# Patient Record
Sex: Male | Born: 1985 | Race: White | Hispanic: No | Marital: Married | Smoking: Never smoker
Health system: Southern US, Community
[De-identification: ages and names within clinical notes are randomized; demographics above are authoritative.]

## PROBLEM LIST (undated history)

## (undated) DIAGNOSIS — K219 Gastro-esophageal reflux disease without esophagitis: Secondary | ICD-10-CM

## (undated) DIAGNOSIS — J050 Acute obstructive laryngitis [croup]: Secondary | ICD-10-CM

## (undated) DIAGNOSIS — B019 Varicella without complication: Secondary | ICD-10-CM

## (undated) DIAGNOSIS — T7840XA Allergy, unspecified, initial encounter: Secondary | ICD-10-CM

## (undated) DIAGNOSIS — G43909 Migraine, unspecified, not intractable, without status migrainosus: Secondary | ICD-10-CM

## (undated) HISTORY — DX: Migraine, unspecified, not intractable, without status migrainosus: G43.909

## (undated) HISTORY — DX: Allergy, unspecified, initial encounter: T78.40XA

## (undated) HISTORY — DX: Acute obstructive laryngitis (croup): J05.0

## (undated) HISTORY — DX: Varicella without complication: B01.9

## (undated) HISTORY — DX: Gastro-esophageal reflux disease without esophagitis: K21.9

---
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
## 2013-04-20 NOTE — Patient Instructions (Signed)
Sinusitis: After Your Visit  Your Care Instructions     Sinusitis is an infection of the lining of the sinus cavities in your head. Sinusitis often follows a cold. It causes pain and pressure in your head and face.  In most cases, sinusitis gets better on its own in 1 to 2 weeks. But some mild symptoms may last for several weeks. Sometimes antibiotics are needed.  Follow-up care is a key part of your treatment and safety. Be sure to make and go to all appointments, and call your doctor if you are having problems. It's also a good idea to know your test results and keep a list of the medicines you take.  How can you care for yourself at home?  ?? Take an over-the-counter pain medicine, such as acetaminophen (Tylenol), ibuprofen (Advil, Motrin), or naproxen (Aleve). Read and follow all instructions on the label.  ?? If the doctor prescribed antibiotics, take them as directed. Do not stop taking them just because you feel better. You need to take the full course of antibiotics.  ?? Be careful when taking over-the-counter cold or flu medicines and Tylenol at the same time. Many of these medicines have acetaminophen, which is Tylenol. Read the labels to make sure that you are not taking more than the recommended dose. Too much acetaminophen (Tylenol) can be harmful.  ?? Breathe warm, moist air from a steamy shower, a hot bath, or a sink filled with hot water. Avoid cold, dry air. Using a humidifier in your home may help. Follow the directions for cleaning the machine.  ?? Use saline (saltwater) nasal washes to help keep your nasal passages open and wash out mucus and bacteria. You can buy saline nose drops at a grocery store or drugstore. Or you can make your own at home by adding 1 teaspoon of salt and 1 teaspoon of baking soda to 2 cups of distilled water. If you make your own, fill a bulb syringe with the solution, insert the tip into your nostril, and squeeze gently. Blow your nose.  ?? Put a hot, wet towel or a warm gel  pack on your face 3 or 4 times a day for 5 to 10 minutes each time.  ?? Try a decongestant nasal spray like oxymetazoline (Afrin). Do not use it for more than 3 days in a row. Using it for more than 3 days can make your congestion worse.  When should you call for help?  Call your doctor now or seek immediate medical care if:  ?? You have new or worse swelling or redness in your face or around your eyes.  ?? You have a new or higher fever.  Watch closely for changes in your health, and be sure to contact your doctor if:  ?? You have new or worse facial pain.  ?? The mucus from your nose becomes thicker (like pus) or has new blood in it.  ?? You are not getting better as expected.   Where can you learn more?   Go to http://www.healthwise.net/[facility]  Enter I933 in the search box to learn more about "Sinusitis: After Your Visit."   ?? 2006-2014 Healthwise, Inc[employer]. Care instructions adapted under license by [employer] (which disclaims liability or warranty for this information). This care instruction is for use with your licensed healthcare professional. If you have questions about a medical condition or this instruction, always ask your healthcare professional. Healthwise, Incorporated disclaims any warranty or liability for your use of this information.    Content Version: 10.1.311062; Current as of: September 30, 2012              Saline Nasal Washes: After Your Visit  Your Care Instructions  Saline nasal washes help keep the nasal passages open by washing out thick or dried mucus. This simple remedy can help relieve symptoms of allergies, sinusitis, and colds. It also can make the nose feel more comfortable by keeping the mucous membranes moist. You may notice a little burning sensation in your nose the first few times you use the solution, but this usually gets better in a few days.  Follow-up care is a key part of your treatment and safety. Be sure to make and go to all appointments, and call your doctor if you are  having problems. It???s also a good idea to know your test results and keep a list of the medicines you take.  How can you care for yourself at home?  ?? You can buy premixed saline solution in a squeeze bottle or other sinus rinse products at a drugstore. Read and follow the instructions on the label.  ?? You also can make your own saline solution by adding 1 teaspoon of salt and 1 teaspoon of baking soda to 2 cups of distilled water.  ?? If you use a homemade solution, pour a small amount into a clean bowl. Using a rubber bulb syringe, squeeze the syringe and place the tip in the salt water. Pull a small amount of the salt water into the syringe by relaxing your hand.  ?? Sit down with your head tilted slightly back. Do not lie down. Put the tip of the bulb syringe or the squeeze bottle a little way into one of your nostrils. Gently squirt a small amount (about 1 teaspoon) into the nostril. Repeat with the other nostril. Some sneezing and gagging are normal at first.  ?? Gently blow your nose.  ?? Wipe the syringe or bottle tip clean after each use.  ?? Repeat this 2 or 3 times a day.  ?? Use nasal washes gently if you have nosebleeds often.  When should you call for help?  Watch closely for changes in your health, and be sure to contact your doctor if:  ?? You often get nosebleeds.  ?? You have problems doing the nasal washes.   Where can you learn more?   Go to http://www.healthwise.net/[facility]  Enter B784 in the search box to learn more about "Saline Nasal Washes: After Your Visit."   ?? 2006-2014 Healthwise, Inc[employer]. Care instructions adapted under license by [employer] (which disclaims liability or warranty for this information). This care instruction is for use with your licensed healthcare professional. If you have questions about a medical condition or this instruction, always ask your healthcare professional. Healthwise, Incorporated disclaims any warranty or liability for your use of this information.   Content Version: 10.1.311062; Current as of: September 30, 2012

## 2013-04-20 NOTE — Progress Notes (Signed)
[facility]  A member of [facility]  [address] [place], [place] [address] * P - [phone] *F - [phone]      Office Progress Note    Primary Care Physician: Lanelle Bal, DO     CHIEF COMPLAINT:     Chief Complaint   Patient presents with   ??? Generalized Body Aches     Pt is here for ear ache, sinus infection, coughong and sore throat.        HISTORY OF PRESENT ILLNESS:   Patient presents today for sinus infection. Rates symptoms as a 6. Symptoms occur constantly. Associated symptoms are ear pressure, nasal congestion, sore throat, pressure in sinuses, and cough with green mucous. Symptoms have been present for 2 days. There are no relieving factors.  There are no worsening factors.   Filed Vitals:    04/20/13 1100   BP: 120/80   Pulse: 84   Temp: 98.3 ??F (36.8 ??C)   TempSrc: Oral   Resp: 16   Height: 5\' 10"  (1.778 m)   Weight: 232 lb 8 oz (105.461 kg)       Body mass index is 33.36 kg/(m^2).  Pain Scale: 0 - No pain/10      Past Medical History:  No past medical history on file.    Family History:  Family History   Problem Relation Age of Onset   ??? No Known Problems Mother    ??? No Known Problems Father    ??? No Known Problems Sister    ??? No Known Problems Brother    ??? No Known Problems Brother        PastSurgical History:  Past Surgical History   Procedure Laterality Date   ??? Hx tonsillectomy     ??? Hx orthopaedic Right      arm fracture       Social History:  History   Smoking status   ??? Never Smoker    Smokeless tobacco   ??? Never Used     History   Alcohol Use   ??? 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 Penicillins Rash       Medication:   none      REVIEW OF SYSTEMS:     General: Patient denies fever, chills, or fatigue.  HEENT:  Nasal congestion and sinus pressure and pressure in ears. Little sore throat. Lots of green  mucous.   Lymph nodes:  Denies swelling or tenderness of lymph nodes.   Chest & Lungs: Denies difficulty breathing or shortness of breath. C/O cough due to postnasal drip.   Heart: Denies chest pain or palpitations.   Skin: Denies changes in skin texture, pigmentation. Denies itching, rash, or eruption.  Endocrine: Denies thyroid tenderness or enlargement. Denies heat or cold intolerance. Denies polyuria, polyphagia, polydipsia. Denies changes in body hair. Denies changes in weight.   Gastrointestinal: Denies heart burn, dysphagia, nausea, vomiting, constipation, or diarrhea. Denies abdominal pain. Denies rectal pain, bleeding, or hemorrhoids   Musculoskeletal: Denies joint stiffness. Denies pain or restriction of ROM.    Neurologic: Denies syncope, seizures, numbness, slurring of speech, headache, loss of memory, and loss of consciousness.  Psychiatric: Denies disturbance of thoughts, mood changes, difficulty  concentrating, sleep disturbances, anxiety, or depression. Denies suicidal thoughts.           PHYSICAL EXAM:    GENERAL: No signs of distress. Alert and cooperative. Well groomed.   HEENT: Normocephalic. Facial symmetry present. Eyes conjunctivae pink, no tearing, no discharge. Sclera white. Cornea clear. Ears normal exam. Tympanic membrane translucent, pearly gray, with no perforations present. Nose & sinuses full nasal passages, tender sinuses to palpate, inflamed nasal turbinates, throat mild erythema  NECK: No jugular vein distention. Trachea midline. Thyroid gland smooth, flat, not enlarges, non tender.  LYMPH: Lymph nodes nonpalpable.   HEART: S1S2 regular rate and rhythm. No murmurs, rub, thrills, bruits, or heaves. No extra heart sounds heard.   LUNGS: Clear to auscultation. No adventitious breath sounds.   ABDOMEN: Abdomen in soft, non-distended with normoactive bowel sounds, no tenderness, and no organomegaly.   SKIN: No rashes or lesions. Skin warm to touch. Turgor resilient. Nails pink, smooth and  cleans. Nails adhered to nail bed with brisk capillary refills.   MUSCULOSKELETAL:  Upper and lower extremities reveal no loss of strength or motion. There is no sensory deficit or instability noted. ROM is full without pain, locking or clicking.  NEUROLOGIC: Cranial nerves 2-12 grossly intact. The patient is alert and oriented x3. Normal mental status without any focal deficits.  PSYCH: The patient's affect is grossly normal, good eye contact, good affect, no suicidal ideations.       Data reviewed.  No results found for any visits on 04/20/13. .      ASSESSMENT:   1. Acute maxillary sinusitis        PLAN:     Orders Placed This Encounter   ??? levofloxacin (LEVAQUIN) 500 mg tablet     Sig: Take 1 tablet by mouth daily.     Dispense:  10 tablet     Refill:  0       Discussed diagnosis and treatment. Reviewed medication dosage, use, side effects, and goals of treatment in detail.  I discussed about the possible side effects or medications. Patient verbalized understanding. I advised patient to call me if any unusual symptoms occur.    All questions answered.       Follow-up Disposition:  Return in about 3 days (around 04/23/2013), or if symptoms worsen or fail to improve.          Tessa Lerner, NP

## 2013-08-27 MED ORDER — CLARITHROMYCIN 500 MG TAB
500 mg | ORAL_TABLET | Freq: Two times a day (BID) | ORAL | Status: AC
Start: 2013-08-27 — End: ?

## 2013-08-27 NOTE — Patient Instructions (Signed)
Rest, fluids

## 2013-08-27 NOTE — Progress Notes (Signed)
Office Progress Note    Primary Care Physician: Lanelle BalSteven Elantra Caprara, DO     Reason for Visit:   Chief Complaint   Patient presents with   ??? Sinus Infection     sore throat, left ear pain, cough       History of Present Illness:  Jonathon Medina is a 28 y.o. male patient who presents with left sided ST, ear pain and nasal congestion with sinus headaches for the last few days. Taking sinus medication that relieved the headaches. Denied fever. Mild cough in AM with production.    Past Medical History:  History reviewed. No pertinent past medical history.     Past Surgical History   Procedure Laterality Date   ??? Hx tonsillectomy     ??? Hx orthopaedic Right      arm fracture       Medication: Denied    Allergy:  Allergies   Allergen Reactions   ??? Penicillins Rash         Social History:   History   Substance Use Topics   ??? Smoking status: Never Smoker    ??? Smokeless tobacco: Never Used   ??? Alcohol Use: Yes      Comment: occasionally     History     Social History Narrative    Married/0    Job: Residence Pharmacist, hospitalDirector student housing at [employer]       Family History:  Family Status   Relation Status Death Age   ??? Mother Alive    ??? Father Alive    ??? Sister Alive    ??? Brother Alive    ??? Brother Alive      Family History   Problem Relation Age of Onset   ??? No Known Problems Mother    ??? No Known Problems Father    ??? No Known Problems Sister    ??? No Known Problems Brother    ??? No Known Problems Brother        Review of Systems:  General: No fever or chills  ENT: No earache or sore throat  Cardiac:  no chest pain or palpitations  Skin: No bleeding or bruising  Endo: No polyuria,  polydipsia  GI: no nausea,  vomiting  GU: no dysuria or discharge  Neuro: No headache or paresthesias  Respiratory: No cough or sputum  Muscular: No arthralgias or arthritis      Physical Exam:  Filed Vitals:    08/27/13 1223   BP: 120/80   Pulse: 70   Temp: 97.9 ??F (36.6 ??C)   TempSrc: Oral   Resp: 16   Height: 5\' 10"  (1.778 m)   Weight: 220 lb (99.791 kg)        Body mass index is 31.57 kg/(m^2).    General: Well developed, well nourished, alert, cooperative, no distress  Eyes: conjunctiva pink, anicteric  ENT: nares patent, pharynx red, TMI and retracted  Neck:  supple, symmetrical, trachea midline, no adenopathy and thyroid: not enlarged, symmetric, no tenderness/mass/nodules   Chest: clear to percussion and auscultation,   Heart: Regular rate and rhythm, S1S2 present or without murmur or extra heart sounds  Abdomen: abdomen is soft without significant tenderness, masses, organomegaly or guarding  Extremity: extremities normal, atraumatic, no cyanosis or edema  Skin: Skin color, texture, turgor normal. No rashes or lesions  Psych: appropiate, and oriented,  Neuro: alert, orientedx3 gait normal, No gross deficits    Assessment:  1. Other acute sinusitis  Plan:   Orders Placed This Encounter   ??? clarithromycin (BIAXIN) 500 mg tablet     Sig: Take 1 Tab by mouth two (2) times a day.     Dispense:  20 Tab     Refill:  0         Patient Instructions   Rest, fluids        Follow-up Disposition:  Return if symptoms worsen or fail to improve.        Lanelle Bal, DO

## 2014-12-21 ENCOUNTER — Other Ambulatory Visit: Payer: Self-pay | Admitting: Medical

## 2014-12-21 DIAGNOSIS — G8929 Other chronic pain: Secondary | ICD-10-CM

## 2014-12-21 DIAGNOSIS — R51 Headache: Principal | ICD-10-CM

## 2014-12-21 DIAGNOSIS — R519 Headache, unspecified: Secondary | ICD-10-CM

## 2014-12-27 ENCOUNTER — Ambulatory Visit
Admission: RE | Admit: 2014-12-27 | Discharge: 2014-12-27 | Disposition: A | Discharge status: Home / Self Care | Payer: BLUE CROSS/BLUE SHIELD | Source: Ambulatory Visit | Attending: Medical | Admitting: Medical

## 2014-12-27 DIAGNOSIS — G8929 Other chronic pain: Secondary | ICD-10-CM

## 2014-12-27 DIAGNOSIS — R519 Headache, unspecified: Secondary | ICD-10-CM

## 2014-12-27 DIAGNOSIS — R51 Headache: Principal | ICD-10-CM

## 2016-09-23 ENCOUNTER — Ambulatory Visit: Payer: Self-pay | Admitting: Medical

## 2016-09-23 ENCOUNTER — Encounter: Payer: Self-pay | Admitting: Medical

## 2016-09-23 VITALS — BP 118/80 | HR 86 | Temp 99.0°F | Resp 16 | Ht 67.0 in | Wt 220.0 lb

## 2016-09-23 DIAGNOSIS — J020 Streptococcal pharyngitis: Secondary | ICD-10-CM

## 2016-09-23 LAB — POCT RAPID STREP A (OFFICE): RAPID STREP A SCREEN: POSITIVE — AB

## 2016-09-23 MED ORDER — AZITHROMYCIN 250 MG PO TABS: ORAL_TABLET | ORAL | 0 refills | Status: DC

## 2016-09-23 NOTE — Progress Notes (Signed)
Subjective:     Anthony Ochoa is a 31 y.o. male who presents for evaluation of sore throat. Associated symptoms include chills, sinus and nasal congestion and sore throat. Onset of symptoms was 3 days ago, and have been gradually worsening since that time. He is drinking plenty of fluids. He has  had a recent close exposure to someone with proven streptococcal pharyngitis. Laryngitis starting yesterday afternoon, intermittent. Took advil today.    Review of Systems  No fever or chills.  St and nasal congestion. No shortness of breath or chest pain. No adenopathy.   Objective:    There were no vitals taken for this visit. General:  alert, cooperative and no distress  Mouth:  abnormal findings: exudates present and moderate oropharyngeal erythema  Neck: mild anterior cervical adenopathy, no JVD and supple, symmetrical, trachea midline. Lungs c;ear to auscultation Heart rrr, no mumurs rubs or gallops   Laboratory Strep test done. Results:positive    Assessment:     Acute Pharyngitis,  Strep throat    Plan:    Use of OTC analgesics recommended as well as salt water gargles.   Azithromycin 250mg  take 2 tablets today then one tablet days 2-5 , take with food. Soft foods , avoiding acidic foods. Return to the clinic in  3-5 d if not improving or if worsening.

## 2016-12-04 ENCOUNTER — Encounter: Payer: Self-pay | Admitting: Medical

## 2016-12-04 ENCOUNTER — Ambulatory Visit: Payer: Self-pay | Admitting: Medical

## 2016-12-04 VITALS — BP 122/80 | HR 74 | Temp 98.4°F | Resp 16 | Ht 71.0 in | Wt 244.0 lb

## 2016-12-04 DIAGNOSIS — R05 Cough: Secondary | ICD-10-CM

## 2016-12-04 DIAGNOSIS — J208 Acute bronchitis due to other specified organisms: Secondary | ICD-10-CM

## 2016-12-04 DIAGNOSIS — Z889 Allergy status to unspecified drugs, medicaments and biological substances status: Secondary | ICD-10-CM

## 2016-12-04 DIAGNOSIS — R059 Cough, unspecified: Secondary | ICD-10-CM

## 2016-12-04 MED ORDER — HYDROCOD POLST-CPM POLST ER 10-8 MG/5ML PO SUER: 5.0000 mL | Freq: Every evening | ORAL | 0 refills | Status: DC | PRN

## 2016-12-04 MED ORDER — PREDNISONE 10 MG (21) PO TBPK: ORAL_TABLET | ORAL | 0 refills | Status: DC

## 2016-12-04 MED ORDER — BENZONATATE 100 MG PO CAPS: 200.0000 mg | ORAL_CAPSULE | Freq: Three times a day (TID) | ORAL | 0 refills | Status: DC | PRN

## 2016-12-04 NOTE — Progress Notes (Signed)
   Subjective:    Patient ID: Anthony Ochoa, male    DOB: 03-13-1986, 31 y.o.   MRN: [id number]  HPI 31 yo male started on  Sunday with  cough productive clear, nasal congestion and drainage clear , and  sore throat. Though it initially was allergies, using allergy medication, Monday used inhaler one puff with no relief. Yesterday increased Albuterol to  2 puffs with some relief.  Unable to sleep at night due to coughing.   Review of Systems  Constitutional: Positive for fatigue. Negative for chills and fever.  HENT: Positive for congestion, postnasal drip, rhinorrhea, sinus pressure, sore throat and trouble swallowing. Negative for ear pain and sinus pain.   Eyes: Negative for pain, discharge and itching.  Respiratory: Positive for cough and shortness of breath. Negative for wheezing.   Cardiovascular: Negative for chest pain.  Gastrointestinal: Negative for diarrhea, nausea and vomiting.  Endocrine: Negative for polydipsia, polyphagia and polyuria.  Genitourinary: Negative for dysuria and hematuria.  Musculoskeletal: Negative for arthralgias and myalgias.  Skin: Negative for rash.  Neurological: Positive for light-headedness. Negative for syncope.  Hematological: Positive for adenopathy.  Psychiatric/Behavioral: Negative for confusion and hallucinations.   Light-headed with coughing at times.    Objective:   Physical Exam  Constitutional: He is oriented to person, place, and time. He appears well-developed and well-nourished.  HENT:  Head: Normocephalic and atraumatic.  Right Ear: Hearing, tympanic membrane, external ear and ear canal normal.  Left Ear: Hearing, tympanic membrane, external ear and ear canal normal.  Nose: Mucosal edema present.  Eyes: Conjunctivae and EOM are normal. Pupils are equal, round, and reactive to light.  Neck: Normal range of motion. Neck supple.  Cardiovascular: Normal rate, regular rhythm and normal heart sounds.  Exam reveals no gallop and no friction  rub.   No murmur heard. Pulmonary/Chest: Effort normal and breath sounds normal.  Musculoskeletal: Normal range of motion.  Lymphadenopathy:    He has no cervical adenopathy.  Neurological: He is alert and oriented to person, place, and time.  Skin: Skin is warm and dry.  Psychiatric: He has a normal mood and affect. His behavior is normal.    Coughing in room with expiratory upper respiratory wheeze.      Assessment & Plan:  Viral Bronchitis, continue to use current medications. E-prescribed prednisone taper ,  6 tablets today then 5 tablets tomorrow , then one less each day thereafter. #21 no refills take with food. Written prescription for  Tussionex  5 ml po at bedtime prn cough 30 cc no refills.  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 Benzonatate  2 capsules 3 times a day ( 2 times per day if using Tussionex at night). Call Friday if bacterial signs or symptoms occur, reviewed  Signs and symptoms with the patient. Use OTC motrin or tylenol take as directed if not feeling well. Return to the clinic in 3 -5 days if not improving or if worsening. No questions at disharge , verbalizes understanding.

## 2016-12-04 NOTE — Patient Instructions (Addendum)
Call Friday if not improving. Take prednisone, benzonatate and tussionex as prescribed. Return to the clinic if not improving.in 3-5 days. Rest , increase fluids.  Take otc motrin or tylenol take as directed.

## 2016-12-05 ENCOUNTER — Encounter: Payer: Self-pay | Admitting: Medical

## 2016-12-05 DIAGNOSIS — J3089 Other allergic rhinitis: Secondary | ICD-10-CM | POA: Insufficient documentation

## 2017-02-18 ENCOUNTER — Encounter: Payer: Self-pay | Admitting: Medical

## 2017-02-18 ENCOUNTER — Ambulatory Visit: Payer: Self-pay | Admitting: Medical

## 2017-02-18 VITALS — BP 130/78 | HR 66 | Temp 97.8°F | Resp 16 | Ht 70.0 in | Wt 243.0 lb

## 2017-02-18 DIAGNOSIS — G43909 Migraine, unspecified, not intractable, without status migrainosus: Secondary | ICD-10-CM

## 2017-02-18 MED ORDER — RIZATRIPTAN BENZOATE 10 MG PO TABS: 10.0000 mg | ORAL_TABLET | ORAL | 0 refills | Status: DC | PRN

## 2017-02-18 MED ORDER — IBUPROFEN 200 MG PO TABS: 800.0000 mg | ORAL_TABLET | Freq: Three times a day (TID) | ORAL | Status: AC

## 2017-02-18 NOTE — Progress Notes (Signed)
   Subjective:    Patient ID: Anthony Ochoa, male    DOB: 06-17-1986, 31 y.o.   MRN: [id number]  HPI  Woke up at  2 :30 am with headache took Tylenol  1 gram and was able to return back to sleep , then woke at  6 am and within one hour of being awake had throbbing eye pain bilateral with it reaching back to towards crown and occiptal region, like previous migraines. Little nausea this am no vomiting, able to return to sleep about  11:30am. Slept till 2 pm and came over for appointment.Wanted refilll over the phone but I had not written for his Maxalt since 2016 ( in [facility] E[facility]). No nausea now. Light sensitiviity, some mild dizziness on/ off. Similar to really bad migraines. 8/10 pain. Out of his Maxalt  medication but feel this works for him. Veryl SpeakUusally helps if taken early on . If migraine presisits without treament has had to take1 then 2 hours later has to take another one. Sound sensitivity with migraines as well.  Review of Systems  Constitutional: Positive for activity change and fatigue. Negative for fever.  HENT: Positive for congestion. Negative for ear pain and sore throat.   Eyes: Positive for photophobia. Negative for discharge and itching.  Respiratory: Negative for cough and shortness of breath.   Cardiovascular: Negative for chest pain.  Gastrointestinal: Negative for abdominal pain.  Genitourinary: Negative for hematuria.  Musculoskeletal: Negative for neck pain.  Skin: Negative for rash.  Neurological: Positive for dizziness and headaches. Negative for syncope and light-headedness.  Psychiatric/Behavioral: Positive for agitation. Negative for behavioral problems, confusion, hallucinations and suicidal ideas. The patient is not nervous/anxious.    Neck aches, get it with tension and stress , working longer hours, leaving office at  6:30 pm to  7pm instead of 5pm x 2 weeks.    Objective:   Physical Exam  Constitutional: He is oriented to person, place, and time. Vital signs are  normal. He appears well-developed and well-nourished.  HENT:  Head: Normocephalic and atraumatic.  Eyes: Pupils are equal, round, and reactive to light. Conjunctivae and EOM are normal.  Neck: Normal range of motion. Neck supple.  Cardiovascular: Normal rate, normal heart sounds and intact distal pulses.   Pulmonary/Chest: Effort normal and breath sounds normal.  Lymphadenopathy:    He has no cervical adenopathy.  Neurological: He is alert and oriented to person, place, and time. No cranial nerve deficit or sensory deficit. He displays a negative Romberg sign. GCS eye subscore is 4. GCS verbal subscore is 5. GCS motor subscore is 6.  Skin: Skin is warm and dry.  Psychiatric: He has a normal mood and affect. His behavior is normal. Judgment and thought content normal.  Nursing note and vitals reviewed. finger to nose, heel to toe with in normal limits.   Looks like he does not feel well  Knows Day , year, president, last known holiday 4th of July.    Assessment & Plan:  Migraine  Rizatriptan 10 mg one tablet by mouth may repeat in  2 hours  one timeif needed. #10 no refills Given Ibuprofen 800 mg in clinic  Lot [id number] exp 05/2019. He is to return to the clinic in 24-48 hours if not improving.

## 2017-02-18 NOTE — Patient Instructions (Signed)

## 2017-04-17 ENCOUNTER — Encounter: Payer: Self-pay | Admitting: Adult Health

## 2017-04-17 ENCOUNTER — Ambulatory Visit: Payer: Self-pay | Admitting: Adult Health

## 2017-04-17 VITALS — BP 128/76 | HR 79 | Temp 98.3°F | Resp 16 | Ht 70.0 in | Wt 246.0 lb

## 2017-04-17 DIAGNOSIS — H60501 Unspecified acute noninfective otitis externa, right ear: Secondary | ICD-10-CM

## 2017-04-17 DIAGNOSIS — H6690 Otitis media, unspecified, unspecified ear: Secondary | ICD-10-CM

## 2017-04-17 MED ORDER — NEOMYCIN-POLYMYXIN-HC 3.5-10000-1 OT SOLN: 3.0000 [drp] | Freq: Three times a day (TID) | OTIC | 0 refills | Status: AC

## 2017-04-17 MED ORDER — AZITHROMYCIN 250 MG PO TABS: ORAL_TABLET | ORAL | 0 refills | Status: DC

## 2017-04-17 NOTE — Progress Notes (Signed)
Subjective:     Patient ID: Anthony Ochoa, male   DOB: February 03, 1986, 31 y.o.   MRN: [id number]  HPI  Patient is a 31 year old male in no acute distress who presents to the clinic in no acute distress. He reports he has had sore throat and congestion x 2 weeks. He has had green/ yellow nasal congestion in the morning. He reports he started with bilateral ear pain this morning- right ear pain worse than left ear. He denies any drainage.  He has been swimming.   Blood pressure 128/76, pulse 79, temperature 98.3 F (36.8 C), temperature source Tympanic, resp. rate 16, height  (1.778 m), weight 246 lb (111.6 kg), SpO2 97 %.  He reports he has an ear infection once per year in the past year. He reports Z-Pack usually clears his ear infection.    Allergies  Allergen Reactions  . Penicillins Rash    31 year old at home was sick this past two weeks and feels better now.   He reports he will be traveling the next two weeks by plane.  Review of Systems  Constitutional: Negative.  Negative for activity change, appetite change, chills, diaphoresis, fatigue, fever and unexpected weight change.  HENT: Positive for congestion (nasal ) and ear pain. Negative for ear discharge, facial swelling, hearing loss, mouth sores, nosebleeds, postnasal drip, rhinorrhea, sinus pain, sinus pressure, sneezing, sore throat, tinnitus, trouble swallowing and voice change.   Eyes: Negative.   Respiratory: Negative.   Cardiovascular: Negative.   Gastrointestinal: Negative.   Endocrine: Negative.   Genitourinary: Negative.   Musculoskeletal: Negative.   Skin: Negative.   Neurological: Negative.   Hematological: Negative.   Psychiatric/Behavioral: Negative.        Objective:   Physical Exam  Constitutional: He is oriented to person, place, and time. He appears well-developed and well-nourished. No distress.  HENT:  Head: Normocephalic and atraumatic.  Right Ear: Hearing normal. No lacerations. There is  swelling (mild ear canal edema able to clearly visualize tympanic membrane ). No tenderness. No foreign bodies. No mastoid tenderness. Tympanic membrane is erythematous (moderate ). Tympanic membrane is not injected, not scarred, not perforated, not retracted and not bulging. Tympanic membrane mobility is normal. A middle ear effusion is present. No hemotympanum.  Left Ear: Hearing, tympanic membrane, external ear and ear canal normal.  Mouth/Throat: Uvula is midline, oropharynx is clear and moist and mucous membranes are normal. No uvula swelling. No oropharyngeal exudate.  Eyes: Pupils are equal, round, and reactive to light. Conjunctivae and EOM are normal. Right eye exhibits no discharge. Left eye exhibits no discharge. No scleral icterus.  Neck: Normal range of motion. Neck supple. No JVD present. No tracheal deviation present. No thyromegaly present.  Cardiovascular: Normal rate, regular rhythm, normal heart sounds and intact distal pulses.  Exam reveals no gallop and no friction rub.   No murmur heard. Pulmonary/Chest: Effort normal and breath sounds normal. No stridor. No respiratory distress. He has no wheezes. He has no rales. He exhibits no tenderness.  Abdominal: He exhibits no distension and no mass. There is no tenderness. There is no rebound and no guarding.  Musculoskeletal: Normal range of motion. He exhibits no edema, tenderness or deformity.  Lymphadenopathy:       Head (right side): Preauricular (mild ) adenopathy present. No submental, no submandibular, no tonsillar, no posterior auricular and no occipital adenopathy present.       Head (left side): No submental, no submandibular, no tonsillar, no preauricular,  no posterior auricular and no occipital adenopathy present.    He has no cervical adenopathy.  Neurological: He is alert and oriented to person, place, and time. He has normal strength and normal reflexes. He displays a negative Romberg sign.  Patient moves on and off of  exam table and in room without difficulty. Gait is normal in hall and in room. Patient is oriented to person place time and situation. Patient answers questions appropriately and engages in conversation.   Skin: Skin is warm, dry and intact. No rash noted. He is not diaphoretic. No cyanosis or erythema. No pallor. Nails show no clubbing.  Psychiatric: He has a normal mood and affect. His speech is normal and behavior is normal. Judgment and thought content normal. Cognition and memory are normal.  Vitals reviewed.      Assessment:    Acute otitis externa of right ear, unspecified type  Acute otitis media, unspecified otitis media type      Plan:     Meds ordered this encounter  Medications  . azithromycin (ZITHROMAX) 250 MG tablet    Sig: Take 2 tablets(500mg )  on day one and take 1 tablet(250MG ) for the next four days until gone    Dispense:  6 tablet    Refill:  0  . neomycin-polymyxin-hydrocortisone (CORTISPORIN) OTIC solution    Sig: Place 3 drops into the right ear 3 (three) times daily.    Dispense:  10 mL    Refill:  0    Return to clinic at any time  if any new symptoms change, worsen or do not improve. Symptoms should improve  within 72 hours and if not improving you should call for an appointment at the clinic is closed or be seen in urgent care/ED if clinic is closed. Follow up with your PCP, Patient verbalized above understanding of all instructions and denies any questions currently.  Patient verbalized understanding of instructions and denies any further questions at this time.

## 2017-04-17 NOTE — Patient Instructions (Signed)
Otitis Externa Otitis externa is an infection of the outer ear canal. The outer ear canal is the area between the outside of the ear and the eardrum. Otitis externa is sometimes called "swimmer's ear." What are the causes? This condition may be caused by:  Swimming in dirty water.  Moisture in the ear.  An injury to the inside of the ear.  An object stuck in the ear.  A cut or scrape on the outside of the ear.  What increases the risk? This condition is more likely to develop in swimmers. What are the signs or symptoms? The first symptom of this condition is often itching in the ear. Later signs and symptoms include:  Swelling of the ear.  Redness in the ear.  Ear pain. The pain may get worse when you pull on your ear.  Pus coming from the ear.  How is this diagnosed? This condition may be diagnosed by examining the ear and testing fluid from the ear for bacteria and funguses. How is this treated? This condition may be treated with:  Antibiotic ear drops. These are often given for 10-14 days.  Medicine to reduce itching and swelling.  Follow these instructions at home:  If you were prescribed antibiotic ear drops, apply them as told by your health care provider. Do not stop using the antibiotic even if your condition improves.  Take over-the-counter and prescription medicines only as told by your health care provider.  Keep all follow-up visits as told by your health care provider. This is important. How is this prevented?  Keep your ear dry. Use the corner of a towel to dry your ear after you swim or bathe.  Avoid scratching or putting things in your ear. Doing these things can damage the ear canal or remove the protective wax that lines it, which makes it easier for bacteria and funguses to grow.  Avoid swimming in lakes, polluted water, or pools that may not have the right amount of chlorine.  Consider making ear drops and putting 3 or 4 drops in each ear after  you swim. Ask your health care provider about how you can make ear drops. Contact a health care provider if:  You have a fever.  After 3 days your ear is still red, swollen, painful, or draining pus.  Your redness, swelling, or pain gets worse.  You have a severe headache.  You have redness, swelling, pain, or tenderness in the area behind your ear. This information is not intended to replace advice given to you by your health care provider. Make sure you discuss any questions you have with your health care provider. Document Released: 07/08/2005 Document Revised: 08/15/2015 Document Reviewed: 04/17/2015 Elsevier Interactive Patient Education  2018 [employer] Otitis Media, Adult Otitis media is redness, soreness, and puffiness (swelling) in the space just behind your eardrum (middle ear). It may be caused by allergies or infection. It often happens along with a cold. Follow these instructions at home:  Take your medicine as told. Finish it even if you start to feel better.  Only take over-the-counter or prescription medicines for pain, discomfort, or fever as told by your doctor.  Follow up with your doctor as told. Contact a doctor if:  You have otitis media only in one ear, or bleeding from your nose, or both.  You notice a lump on your neck.  You are not getting better in 3-5 days.  You feel worse instead of better. Get help right away if:    You have pain that is not helped with medicine.  You have puffiness, redness, or pain around your ear.  You get a stiff neck.  You cannot move part of your face (paralysis).  You notice that the bone behind your ear hurts when you touch it. This information is not intended to replace advice given to you by your health care provider. Make sure you discuss any questions you have with your health care provider. Document Released: 12/25/2007 Document Revised: 12/14/2015 Document Reviewed: 02/02/2013 Elsevier Interactive Patient  Education  2017 [employer]  

## 2017-07-07 DIAGNOSIS — Z23 Encounter for immunization: Secondary | ICD-10-CM | POA: Diagnosis not present

## 2017-11-07 ENCOUNTER — Ambulatory Visit: Payer: Self-pay | Admitting: Adult Health

## 2017-11-07 ENCOUNTER — Encounter: Payer: Self-pay | Admitting: Adult Health

## 2017-11-07 VITALS — BP 135/78 | HR 73 | Temp 98.1°F | Resp 16 | Ht 71.0 in | Wt 241.0 lb

## 2017-11-07 DIAGNOSIS — G43909 Migraine, unspecified, not intractable, without status migrainosus: Secondary | ICD-10-CM

## 2017-11-07 DIAGNOSIS — J301 Allergic rhinitis due to pollen: Secondary | ICD-10-CM

## 2017-11-07 MED ORDER — RIZATRIPTAN BENZOATE 10 MG PO TABS: 10.0000 mg | ORAL_TABLET | ORAL | 0 refills | Status: DC | PRN

## 2017-11-07 NOTE — Progress Notes (Signed)
Subjective:    Patient ID: Anthony Ochoa, male   DOB: 1985/09/15, 32 y.o.   MRN: [id number]   Blood pressure 135/78, pulse 73, temperature 98.1 F (36.7 C), resp. rate 16, height 5\' 11"  (1.803 m), weight 241 lb (109.3 kg), SpO2 99 %.   Patient is a 32 year old male who comes to the clinic for migraine medication refill. He does not have a headache today. He reports most of the time he can take two Extra Strength Tylenol and resolve headache.  He reports he had migraine on Monday that lasted most of the day and initially was not relieved by two extra strength Tylenol that morning of 11/03/17 - he tool Maxalt as prescribed  - this headache had mild photosensitivity. He reports he felt better on Tuesday.  He reports history of two migraines as above since January of this  He reports he has noticed his allergies have been actng up recently. Denies any pain currently.   Denies dizziness, lightheadedness, nausea or vomiting with migraines.  Patient  denies any fever, body aches,chills, rash, chest pain, shortness of breath, nausea, vomiting, or diarrhea.    Review of Systems  Constitutional: Negative for fever and weight loss.  HENT: Positive for rhinorrhea and sinus pressure. Negative for ear pain, hearing loss, sore throat and tinnitus.   Eyes: Positive for photophobia (mild ). Negative for blurred vision, pain and redness.  Respiratory: Negative for cough.   Gastrointestinal: Negative for abdominal pain, anorexia, nausea and vomiting.  Musculoskeletal: Negative for back pain and neck pain.  Neurological: Positive for headaches. Negative for dizziness, tingling, seizures, weakness, numbness and loss of balance.  Psychiatric/Behavioral: The patient does not have insomnia.      Objective:   Physical Exam  Constitutional: He is oriented to person, place, and time. Vital signs are normal. He appears well-developed and well-nourished. He is active.  Non-toxic appearance. He does not have a sickly  appearance. He does not appear ill. No distress. He is not intubated.  Patient is alert and oriented and responsive to questions Engages in eye contact with provider. Speaks in full sentences without any pauses without any shortness of breath.    Patient moves on and off of exam table and in room without difficulty. Gait is normal in hall and in room. Patient is oriented to person place time and situation. Patient answers questions appropriately and engages in conversation.   HENT:  Head: Normocephalic and atraumatic.  Right Ear: Hearing, external ear and ear canal normal. No tenderness. Tympanic membrane is not perforated and not erythematous. A middle ear effusion is present.  Left Ear: Hearing, external ear and ear canal normal. No tenderness. Tympanic membrane is not perforated and not erythematous. A middle ear effusion is present.  Nose: Rhinorrhea present. No mucosal edema. Right sinus exhibits no maxillary sinus tenderness and no frontal sinus tenderness. Left sinus exhibits no maxillary sinus tenderness and no frontal sinus tenderness.  Mouth/Throat: Uvula is midline, oropharynx is clear and moist and mucous membranes are normal. No oropharyngeal exudate, posterior oropharyngeal edema, posterior oropharyngeal erythema or tonsillar abscesses.  Eyes: Pupils are equal, round, and reactive to light. Conjunctivae, EOM and lids are normal. Right eye exhibits no discharge. Left eye exhibits no discharge. No scleral icterus.  Neck: Normal range of motion. Neck supple. No JVD present. No tracheal deviation present.  Cardiovascular: Normal rate, regular rhythm, S1 normal, S2 normal, normal heart sounds and intact distal pulses. Exam reveals no gallop, no distant heart sounds  and no friction rub.  No murmur heard. Pulmonary/Chest: Effort normal and breath sounds normal. No accessory muscle usage or stridor. No apnea, no tachypnea and no bradypnea. He is not intubated. No respiratory distress. He has no  wheezes. He has no rales. He exhibits no tenderness.  Abdominal: Soft. Bowel sounds are normal. There is no tenderness.  Musculoskeletal: Normal range of motion.  Lymphadenopathy:       Head (right side): No submental, no submandibular, no tonsillar, no preauricular, no posterior auricular and no occipital adenopathy present.       Head (left side): No submental, no submandibular, no tonsillar, no preauricular, no posterior auricular and no occipital adenopathy present.    He has no cervical adenopathy.       Right cervical: No superficial cervical, no deep cervical and no posterior cervical adenopathy present.      Left cervical: No superficial cervical, no deep cervical and no posterior cervical adenopathy present.  Neurological: He is alert and oriented to person, place, and time. He has normal strength and normal reflexes. He displays no atrophy, no tremor and normal reflexes. No cranial nerve deficit or sensory deficit. He exhibits normal muscle tone. He displays a negative Romberg sign. He displays no seizure activity. Coordination and gait normal.  Skin: Skin is warm, dry and intact. No rash noted. He is not diaphoretic. No cyanosis or erythema. No pallor. Nails show no clubbing.  Psychiatric: He has a normal mood and affect. His behavior is normal. Judgment and thought content normal.  Vitals reviewed.      Assessment:    Seasonal allergic rhinitis due to pollen  Migraine without status migrainosus, not intractable, unspecified migraine type - Plan: rizatriptan (MAXALT) 10 MG tablet  No active migraine at time of visit  visit.      Plan:   Meds ordered this encounter  Medications  . rizatriptan (MAXALT) 10 MG tablet    Sig: Take 1 tablet (10 mg total) by mouth as needed for migraine. May repeat in 2 hours if needed    Dispense:  10 tablet    Refill:  0   He reports he has been taking Zyrtec daily for three years and using Flonase occasionally. Recommended he can switch to  either Claritin or Allegra and see if he notices improvement in allergy symptoms per package instructions. Use Flonase daily return to office by calling for an appointment if symptoms do not improve at any time.  Advised need for primary care MD, Provider also recommends patient see primary care physician for a routine physical and to establish primary care. Patient may chose provider of choice. Also gave the [facility]  PHYSICIAN REFERRAL LINE at [phone] or web site at [email or web address] to help assist with finding a primary care doctor. Patient understands this office is acute care and no longer taking new primary care patient agrees to call   Advised patient call the office or your primary care doctor for an appointment if no improvement within 72 hours or if any symptoms change or worsen at any time  Advised ER or urgent Care if after hours or on weekend. Call 911 for emergency symptoms at any time.Patinet verbalized understanding of all instructions given/reviewed and treatment plan and has no further questions or concerns at this time.    Patient verbalized understanding of all instructions given and denies any further questions at this time.

## 2017-11-07 NOTE — Patient Instructions (Addendum)
General Headache Without Cause A headache is pain or discomfort felt around the head or neck area. There are many causes and types of headaches. In some cases, the cause may not be found. Follow these instructions at home: Managing pain  Take over-the-counter and prescription medicines only as told by your doctor.  Lie down in a dark, quiet room when you have a headache.  If directed, apply ice to the head and neck area: ? Put ice in a plastic bag. ? Place a towel between your skin and the bag. ? Leave the ice on for 20 minutes, 2-3 times per day.  Use a heating pad or hot shower to apply heat to the head and neck area as told by your doctor.  Keep lights dim if bright lights bother you or make your headaches worse. Eating and drinking  Eat meals on a regular schedule.  Lessen how much alcohol you drink.  Lessen how much caffeine you drink, or stop drinking caffeine. General instructions  Keep all follow-up visits as told by your doctor. This is important.  Keep a journal to find out if certain things bring on headaches. For example, write down: ? What you eat and drink. ? How much sleep you get. ? Any change to your diet or medicines.  Relax by getting a massage or doing other relaxing activities.  Lessen stress.  Sit up straight. Do not tighten (tense) your muscles.  Do not use tobacco products. This includes cigarettes, chewing tobacco, or e-cigarettes. If you need help quitting, ask your doctor.  Exercise regularly as told by your doctor.  Get enough sleep. This often means 7-9 hours of sleep. Contact a doctor if:  Your symptoms are not helped by medicine.  You have a headache that feels different than the other headaches.  You feel sick to your stomach (nauseous) or you throw up (vomit).  You have a fever. Get help right away if:  Your headache becomes really bad.  You keep throwing up.  You have a stiff neck.  You have trouble seeing.  You have  trouble speaking.  You have pain in the eye or ear.  Your muscles are weak or you lose muscle control.  You lose your balance or have trouble walking.  You feel like you will pass out (faint) or you pass out.  You have confusion. This information is not intended to replace advice given to you by your health care provider. Make sure you discuss any questions you have with your health care provider. Document Released: 04/16/2008 Document Revised: 12/14/2015 Document Reviewed: 10/31/2014 Elsevier Interactive Patient Education  2018 [employer]. Migraine Headache A migraine headache is a very strong throbbing pain on one side or both sides of your head. Migraines can also cause other symptoms. Talk with your doctor about what things may bring on (trigger) your migraine headaches. Follow these instructions at home: Medicines  Take over-the-counter and prescription medicines only as told by your doctor.  Do not drive or use heavy machinery while taking prescription pain medicine.  To prevent or treat constipation while you are taking prescription pain medicine, your doctor may recommend that you: ? Drink enough fluid to keep your pee (urine) clear or pale yellow. ? Take over-the-counter or prescription medicines. ? Eat foods that are high in fiber. These include fresh fruits and vegetables, whole grains, and beans. ? Limit foods that are high in fat and processed sugars. These include fried and sweet foods. Lifestyle  Avoid alcohol.  Do not use any products that contain nicotine or tobacco, such as cigarettes and e-cigarettes. If you need help quitting, ask your doctor.  Get at least 8 hours of sleep every night.  Limit your stress. General instructions   Keep a journal to find out what may bring on your migraines. For example, write down: ? What you eat and drink. ? How much sleep you get. ? Any change in what you eat or drink. ? Any change in your medicines.  If you have a  migraine: ? Avoid things that make your symptoms worse, such as bright lights. ? It may help to lie down in a dark, quiet room. ? Do not drive or use heavy machinery. ? Ask your doctor what activities are safe for you.  Keep all follow-up visits as told by your doctor. This is important. Contact a doctor if:  You get a migraine that is different or worse than your usual migraines. Get help right away if:  Your migraine gets very bad.  You have a fever.  You have a stiff neck.  You have trouble seeing.  Your muscles feel weak or like you cannot control them.  You start to lose your balance a lot.  You start to have trouble walking.  You pass out (faint). This information is not intended to replace advice given to you by your health care provider. Make sure you discuss any questions you have with your health care provider. Document Released: 04/16/2008 Document Revised: 01/26/2016 Document Reviewed: 12/25/2015 Elsevier Interactive Patient Education  2018 [employer]. Rizatriptan tablets What is this medicine? RIZATRIPTAN (rye za TRIP tan) is used to treat migraines with or without aura. An aura is a strange feeling or visual disturbance that warns you of an attack. It is not used to prevent migraines. This medicine may be used for other purposes; ask your health care provider or pharmacist if you have questions. COMMON BRAND NAME(S): Maxalt What should I tell my health care provider before I take this medicine? They need to know if you have any of these conditions: -bowel disease or colitis -diabetes -family history of heart disease -fast or irregular heart beat -heart or blood vessel disease, angina (chest pain), or previous heart attack -high blood pressure -high cholesterol -history of stroke, transient ischemic attacks (TIAs or mini-strokes), or intracranial bleeding -kidney or liver disease -overweight -poor circulation -postmenopausal or surgical removal of  uterus and ovaries -Raynaud's disease -seizure disorder -an unusual or allergic reaction to rizatriptan, other medicines, foods, dyes, or preservatives -pregnant or trying to get pregnant -breast-feeding How should I use this medicine? This medicine is taken by mouth with a glass of water. Follow the directions on the prescription label. This medicine is taken at the first symptoms of a migraine. It is not for everyday use. If your migraine headache returns after one dose, you can take another dose as directed. You must leave at least 2 hours between doses, and do not take more than 30 mg total in 24 hours. If there is no improvement at all after the first dose, do not take a second dose without talking to your doctor or health care professional. Do not take your medicine more often than directed. Talk to your pediatrician regarding the use of this medicine in children. While this drug may be prescribed for children as young as 6 years for selected conditions, precautions do apply. Overdosage: If you think you have taken too much of this medicine contact a poison  control center or emergency room at once. NOTE: This medicine is only for you. Do not share this medicine with others. What if I miss a dose? This does not apply; this medicine is not for regular use. What may interact with this medicine? Do not take this medicine with any of the following medicines: -amphetamine, dextroamphetamine or cocaine -dihydroergotamine, ergotamine, ergoloid mesylates, methysergide, or ergot-type medication - do not take within 24 hours of taking rizatriptan -feverfew -MAOIs like Carbex, Eldepryl, Marplan, Nardil, and Parnate - do not take rizatriptan within 2 weeks of stopping MAOI therapy. -other migraine medicines like almotriptan, eletriptan, naratriptan, sumatriptan, zolmitriptan - do not take within 24 hours of taking rizatriptan -tryptophan This medicine may also interact with the following  medications: -medicines for mental depression, anxiety or mood problems -propranolol This list may not describe all possible interactions. Give your health care provider a list of all the medicines, herbs, non-prescription drugs, or dietary supplements you use. Also tell them if you smoke, drink alcohol, or use illegal drugs. Some items may interact with your medicine. What should I watch for while using this medicine? Only take this medicine for a migraine headache. Take it if you get warning symptoms or at the start of a migraine attack. It is not for regular use to prevent migraine attacks. You may get drowsy or dizzy. Do not drive, use machinery, or do anything that needs mental alertness until you know how this medicine affects you. To reduce dizzy or fainting spells, do not sit or stand up quickly, especially if you are an older patient. Alcohol can increase drowsiness, dizziness and flushing. Avoid alcoholic drinks. Smoking cigarettes may increase the risk of heart-related side effects from using this medicine. If you take migraine medicines for 10 or more days a month, your migraines may get worse. Keep a diary of headache days and medicine use. Contact your healthcare professional if your migraine attacks occur more frequently. What side effects may I notice from receiving this medicine? Side effects that you should report to your doctor or health care professional as soon as possible: -allergic reactions like skin rash, itching or hives, swelling of the face, lips, or tongue -fast, slow, or irregular heart beat -increased or decreased blood pressure -seizures -severe stomach pain and cramping, bloody diarrhea -signs and symptoms of a blood clot such as breathing problems; changes in vision; chest pain; severe, sudden headache; pain, swelling, warmth in the leg; trouble speaking; sudden numbness or weakness of the face, arm or leg -tingling, pain, or numbness in the face, hands, or  feet Side effects that usually do not require medical attention (report to your doctor or health care professional if they continue or are bothersome): -drowsiness -dry mouth -feeling warm, flushing, or redness of the face -headache -muscle cramps, pain -nausea, vomiting -unusually weak or tired This list may not describe all possible side effects. Call your doctor for medical advice about side effects. You may report side effects to FDA at 1-800-FDA-1088. Where should I keep my medicine? Keep out of the reach of children. Store at room temperature between 15 and 30 degrees C (59 and 86 degrees F). Keep container tightly closed. Throw away any unused medicine after the expiration date. NOTE: This sheet is a summary. It may not cover all possible information. If you have questions about this medicine, talk to your doctor, pharmacist, or health care provider.  2018 Elsevier/Gold Standard (2013-03-09 10:16:39) Allergic Rhinitis, Adult Allergic rhinitis is an allergic reaction that affects the  mucous membrane inside the nose. It causes sneezing, a runny or stuffy nose, and the feeling of mucus going down the back of the throat (postnasal drip). Allergic rhinitis can be mild to severe. There are two types of allergic rhinitis:  Seasonal. This type is also called hay fever. It happens only during certain seasons.  Perennial. This type can happen at any time of the year.  What are the causes? This condition happens when the body's defense system (immune system) responds to certain harmless substances called allergens as though they were germs.  Seasonal allergic rhinitis is triggered by pollen, which can come from grasses, trees, and weeds. Perennial allergic rhinitis may be caused by:  House dust mites.  Pet dander.  Mold spores.  What are the signs or symptoms? Symptoms of this condition include:  Sneezing.  Runny or stuffy nose (nasal congestion).  Postnasal drip.  Itchy  nose.  Tearing of the eyes.  Trouble sleeping.  Daytime sleepiness.  How is this diagnosed? This condition may be diagnosed based on:  Your medical history.  A physical exam.  Tests to check for related conditions, such as: ? Asthma. ? Pink eye. ? Ear infection. ? Upper respiratory infection.  Tests to find out which allergens trigger your symptoms. These may include skin or blood tests.  How is this treated? There is no cure for this condition, but treatment can help control symptoms. Treatment may include:  Taking medicines that block allergy symptoms, such as antihistamines. Medicine may be given as a shot, nasal spray, or pill.  Avoiding the allergen.  Desensitization. This treatment involves getting ongoing shots until your body becomes less sensitive to the allergen. This treatment may be done if other treatments do not help.  If taking medicine and avoiding the allergen does not work, new, stronger medicines may be prescribed.  Follow these instructions at home:  Find out what you are allergic to. Common allergens include smoke, dust, and pollen.  Avoid the things you are allergic to. These are some things you can do to help avoid allergens: ? Replace carpet with wood, tile, or vinyl flooring. Carpet can trap dander and dust. ? Do not smoke. Do not allow smoking in your home. ? Change your heating and air conditioning filter at least once a month. ? During allergy season:  Keep windows closed as much as possible.  Plan outdoor activities when pollen counts are lowest. This is usually during the evening hours.  When coming indoors, change clothing and shower before sitting on furniture or bedding.  Take over-the-counter and prescription medicines only as told by your health care provider.  Keep all follow-up visits as told by your health care provider. This is important. Contact a health care provider if:  You have a fever.  You develop a persistent  cough.  You make whistling sounds when you breathe (you wheeze).  Your symptoms interfere with your normal daily activities. Get help right away if:  You have shortness of breath. Summary  This condition can be managed by taking medicines as directed and avoiding allergens.  Contact your health care provider if you develop a persistent cough or fever.  During allergy season, keep windows closed as much as possible. This information is not intended to replace advice given to you by your health care provider. Make sure you discuss any questions you have with your health care provider. Document Released: 04/02/2001 Document Revised: 08/15/2016 Document Reviewed: 08/15/2016 Elsevier Interactive Patient Education  [employer]2018 Elsevier Inc.

## 2017-11-10 ENCOUNTER — Encounter: Payer: Self-pay | Admitting: Medical

## 2017-11-11 NOTE — Telephone Encounter (Signed)
Sent patient message

## 2017-11-14 ENCOUNTER — Ambulatory Visit: Payer: Self-pay | Admitting: Adult Health

## 2017-12-23 ENCOUNTER — Ambulatory Visit: Payer: Self-pay | Admitting: Medical

## 2017-12-23 ENCOUNTER — Encounter: Payer: Self-pay | Admitting: Medical

## 2017-12-23 VITALS — BP 119/67 | HR 73 | Temp 97.2°F | Resp 16 | Wt 239.8 lb

## 2017-12-23 DIAGNOSIS — L259 Unspecified contact dermatitis, unspecified cause: Secondary | ICD-10-CM

## 2017-12-23 MED ORDER — PREDNISONE 10 MG (21) PO TBPK: ORAL_TABLET | ORAL | 0 refills | Status: DC

## 2017-12-23 NOTE — Progress Notes (Signed)
   Subjective:    Patient ID: Anthony Ochoa, male    DOB: 10-03-1985, 32 y.o.   MRN: [id number]  HPI 32yo male in non acute distress. Presents today with rash.Thursday with itching one area. Doing yard work and then had red bumps on shin of left leg. Used shoe to itch calf and spread it to his calf area. No fever or chills.  Taking Zyrtec.    Review of Systems  Constitutional: Negative for chills and fever.  Respiratory: Negative for cough and shortness of breath.   Cardiovascular: Negative for chest pain.  Skin: Positive for rash (itching left calf and shin area).  Allergic/Immunologic: Positive for environmental allergies.       Objective:   Physical Exam  Constitutional: He is oriented to person, place, and time. He appears well-developed and well-nourished.  HENT:  Head: Normocephalic and atraumatic.  Eyes: Pupils are equal, round, and reactive to light. Conjunctivae and EOM are normal.  Neurological: He is alert and oriented to person, place, and time.  Skin: Rash (erythema, swelling, stripe of maculopapular area running vertically  aprroximately  12-15 cm and on posterior lower leg proximal erythema area , circular erythema approx  8 cm) noted.  Psychiatric: He has a normal mood and affect. His behavior is normal. Thought content normal.  Nursing note and vitals reviewed.        Assessment & Plan:  Contact dermatitis from plant Meds ordered this encounter  Medications  . predniSONE (STERAPRED UNI-PAK 21 TAB) 10 MG (21) TBPK tablet    Sig: Take 6 tablets by mouth today and then  5 tablets tomorrow then one less every day thereafter. Take with food.    Dispense:  21 tablet    Refill:  0  Reviewed signs and symptoms of infection to return to the clinic if these occur. Reviewed wound care with patient, avoid itching and scratching.  Return to clinic prn. Patient verbalizes understanding and has no questions at discharge.

## 2018-03-20 ENCOUNTER — Encounter: Payer: Self-pay | Admitting: Adult Health

## 2018-03-20 ENCOUNTER — Ambulatory Visit: Payer: Self-pay | Admitting: Adult Health

## 2018-03-20 VITALS — BP 124/80 | HR 79 | Temp 97.6°F | Resp 16 | Ht 70.0 in | Wt 228.0 lb

## 2018-03-20 DIAGNOSIS — H66001 Acute suppurative otitis media without spontaneous rupture of ear drum, right ear: Secondary | ICD-10-CM

## 2018-03-20 MED ORDER — AZITHROMYCIN 250 MG PO TABS: ORAL_TABLET | ORAL | 0 refills | Status: DC

## 2018-03-20 NOTE — Patient Instructions (Signed)
Azithromycin tablets What is this medicine? AZITHROMYCIN (az ith roe MYE sin) is a macrolide antibiotic. It is used to treat or prevent certain kinds of bacterial infections. It will not work for colds, flu, or other viral infections. This medicine may be used for other purposes; ask your health care provider or pharmacist if you have questions. COMMON BRAND NAME(S): Zithromax, Zithromax Tri-Pak, Zithromax Z-Pak What should I tell my health care provider before I take this medicine? They need to know if you have any of these conditions: -kidney disease -liver disease -irregular heartbeat or heart disease -an unusual or allergic reaction to azithromycin, erythromycin, other macrolide antibiotics, foods, dyes, or preservatives -pregnant or trying to get pregnant -breast-feeding How should I use this medicine? Take this medicine by mouth with a full glass of water. Follow the directions on the prescription label. The tablets can be taken with food or on an empty stomach. If the medicine upsets your stomach, take it with food. Take your medicine at regular intervals. Do not take your medicine more often than directed. Take all of your medicine as directed even if you think your are better. Do not skip doses or stop your medicine early. Talk to your pediatrician regarding the use of this medicine in children. While this drug may be prescribed for children as young as 6 months for selected conditions, precautions do apply. Overdosage: If you think you have taken too much of this medicine contact a poison control center or emergency room at once. NOTE: This medicine is only for you. Do not share this medicine with others. What if I miss a dose? If you miss a dose, take it as soon as you can. If it is almost time for your next dose, take only that dose. Do not take double or extra doses. What may interact with this medicine? Do not take this medicine with any of the following  medications: -lincomycin This medicine may also interact with the following medications: -amiodarone -antacids -birth control pills -cyclosporine -digoxin -magnesium -nelfinavir -phenytoin -warfarin This list may not describe all possible interactions. Give your health care provider a list of all the medicines, herbs, non-prescription drugs, or dietary supplements you use. Also tell them if you smoke, drink alcohol, or use illegal drugs. Some items may interact with your medicine. What should I watch for while using this medicine? Tell your doctor or healthcare professional if your symptoms do not start to get better or if they get worse. Do not treat diarrhea with over the counter products. Contact your doctor if you have diarrhea that lasts more than 2 days or if it is severe and watery. This medicine can make you more sensitive to the sun. Keep out of the sun. If you cannot avoid being in the sun, wear protective clothing and use sunscreen. Do not use sun lamps or tanning beds/booths. What side effects may I notice from receiving this medicine? Side effects that you should report to your doctor or health care professional as soon as possible: -allergic reactions like skin rash, itching or hives, swelling of the face, lips, or tongue -confusion, nightmares or hallucinations -dark urine -difficulty breathing -hearing loss -irregular heartbeat or chest pain -pain or difficulty passing urine -redness, blistering, peeling or loosening of the skin, including inside the mouth -white patches or sores in the mouth -yellowing of the eyes or skin Side effects that usually do not require medical attention (report to your doctor or health care professional if they continue or are bothersome): -  diarrhea -dizziness, drowsiness -headache -stomach upset or vomiting -tooth discoloration -vaginal irritation This list may not describe all possible side effects. Call your doctor for medical advice  about side effects. You may report side effects to FDA at 1-800-FDA-1088. Where should I keep my medicine? Keep out of the reach of children. Store at room temperature between 15 and 30 degrees C (59 and 86 degrees F). Throw away any unused medicine after the expiration date. NOTE: This sheet is a summary. It may not cover all possible information. If you have questions about this medicine, talk to your doctor, pharmacist, or health care provider.  2018 Elsevier/Gold Standard (2015-09-05 15:26:03) Otitis Media, Adult Otitis media is redness, soreness, and puffiness (swelling) in the space just behind your eardrum (middle ear). It may be caused by allergies or infection. It often happens along with a cold. Follow these instructions at home:  Take your medicine as told. Finish it even if you start to feel better.  Only take over-the-counter or prescription medicines for pain, discomfort, or fever as told by your doctor.  Follow up with your doctor as told. Contact a doctor if:  You have otitis media only in one ear, or bleeding from your nose, or both.  You notice a lump on your neck.  You are not getting better in 3-5 days.  You feel worse instead of better. Get help right away if:  You have pain that is not helped with medicine.  You have puffiness, redness, or pain around your ear.  You get a stiff neck.  You cannot move part of your face (paralysis).  You notice that the bone behind your ear hurts when you touch it. This information is not intended to replace advice given to you by your health care provider. Make sure you discuss any questions you have with your health care provider. Document Released: 12/25/2007 Document Revised: 12/14/2015 Document Reviewed: 02/02/2013 Elsevier Interactive Patient Education  2017 Elsevier Inc.  

## 2018-03-20 NOTE — Progress Notes (Signed)
Subjective:     Patient ID: Anthony Ochoa, male   DOB: 09-20-1985, 32 y.o.   MRN: [id number]  HPI   Blood pressure 124/80, pulse 79, temperature 97.6 F (36.4 C), resp. rate 16, height 5\' 10"  (1.778 m), weight 228 lb (103.4 kg), SpO2 98 %.   Patient is 32 year old male in no acute distress who comes to the clinic with sore throat, ear pain. Onset 03/18/18. He also reports yellow head congestion. Right ear pain is worse. Runny. Good appetite.  He uses zyrtec and Flonase daily. Mild Fatigue.  Patient  denies any fever, body aches,chills, rash, chest pain, shortness of breath, nausea, vomiting, or diarrhea.    Wife had sinus infection two weeks ago per patient was on antibiotics. 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   Allergies  Allergen Reactions  . Penicillins Rash    Review of Systems  Constitutional: Positive for fatigue. Negative for activity change, appetite change, chills, diaphoresis, fever and unexpected weight change.  HENT: Positive for congestion, ear pain (worse on right ), postnasal drip, rhinorrhea, sinus pressure and sore throat. Negative for sneezing, tinnitus, trouble swallowing and voice change.   Eyes: Negative.   Respiratory: Negative.   Cardiovascular: Negative.   Gastrointestinal: Negative.   Endocrine: Negative.   Genitourinary: Negative.   Musculoskeletal: Negative.   Skin: Negative.   Allergic/Immunologic:        -- Penicillins -- Rash   Neurological: Negative.   Hematological: Negative.   Psychiatric/Behavioral: Negative.        Objective:   Physical Exam  Constitutional: He is oriented to person, place, and time. Vital signs are normal. He appears well-developed and well-nourished. He is active.  Non-toxic appearance. He does not appear ill. No distress.  Patient is alert and oriented and responsive to questions Engages in eye contact with provider. Speaks in full sentences without any pauses without any shortness of breath or distress.     HENT:  Head: Normocephalic and atraumatic.  Right Ear: Hearing, external ear and ear canal normal. No drainage, swelling or tenderness. Tympanic membrane is erythematous. Tympanic membrane is not retracted. A middle ear effusion is present. No decreased hearing is noted.  Left Ear: Hearing, external ear and ear canal normal. No drainage, swelling or tenderness. Tympanic membrane is not erythematous and not retracted. A middle ear effusion is present. No decreased hearing is noted.  Nose: Mucosal edema and rhinorrhea present. Right sinus exhibits no maxillary sinus tenderness and no frontal sinus tenderness. Left sinus exhibits no maxillary sinus tenderness and no frontal sinus tenderness.  Mouth/Throat: Uvula is midline, oropharynx is clear and moist and mucous membranes are normal. No oropharyngeal exudate.  Mildly bulging right tympanic membrane dull and yellow brown fluid in appearance behind. No perforation or fluid noted.   Eyes: Pupils are equal, round, and reactive to light. Conjunctivae, EOM and lids are normal. Right eye exhibits no discharge. Left eye exhibits no discharge. No scleral icterus.  Neck: Normal range of motion. Neck supple. No JVD present. No tracheal deviation present.  Cardiovascular: Normal rate, regular rhythm, normal heart sounds and intact distal pulses. Exam reveals no gallop and no friction rub.  No murmur heard. Pulmonary/Chest: Effort normal and breath sounds normal. No stridor. No respiratory distress. He has no wheezes. He has no rales. He exhibits no tenderness.  Abdominal: Soft. Bowel sounds are normal.  Musculoskeletal: Normal range of motion.  Patient moves on and off of exam table and in room  without difficulty. Gait is normal in hall and in room. Patient is oriented to person place time and situation. Patient answers questions appropriately and engages in conversation.   Lymphadenopathy:       Head (right side): No submental, no submandibular, no tonsillar,  no preauricular, no posterior auricular and no occipital adenopathy present.       Head (left side): No submental, no submandibular, no tonsillar, no preauricular, no posterior auricular and no occipital adenopathy present.    He has no cervical adenopathy.  Neurological: He is alert and oriented to person, place, and time. He has normal strength. He displays normal reflexes. No cranial nerve deficit or sensory deficit. He exhibits normal muscle tone. He displays a negative Romberg sign. Coordination normal. GCS eye subscore is 4. GCS verbal subscore is 5. GCS motor subscore is 6.  Skin: Skin is warm and dry. Capillary refill takes less than 2 seconds. No rash noted. He is not diaphoretic. No erythema. No pallor.  Psychiatric: He has a normal mood and affect. His speech is normal and behavior is normal. Judgment and thought content normal. Cognition and memory are normal.  Vitals reviewed.      Assessment:     Non-recurrent acute suppurative otitis media of right ear without spontaneous rupture of tympanic membrane      Plan:        Meds ordered this encounter  Medications  . azithromycin (ZITHROMAX) 250 MG tablet    Sig: By mouth Take 2 tablets day 1 (500mg  total) and 1 tablet ( 250 mg ) on days 2,3,4,5.    Dispense:  6 tablet    Refill:  0    Advised patient call the office or your primary care doctor for an appointment if no improvement within 72 hours or if any symptoms change or worsen at any time  Advised ER or urgent Care if after hours or on weekend. Call 911 for emergency symptoms at any time.Patinet verbalized understanding of all instructions given/reviewed and treatment plan and has no further questions or concerns at this time.    Patient verbalized understanding of all instructions given and denies any further questions at this time.

## 2018-03-24 ENCOUNTER — Ambulatory Visit: Payer: BLUE CROSS/BLUE SHIELD | Admitting: Primary Care

## 2018-04-16 ENCOUNTER — Ambulatory Visit: Payer: BLUE CROSS/BLUE SHIELD | Admitting: Primary Care

## 2018-04-16 ENCOUNTER — Encounter: Payer: Self-pay | Admitting: Primary Care

## 2018-04-16 VITALS — BP 124/84 | HR 61 | Temp 98.5°F | Ht 69.0 in | Wt 243.5 lb

## 2018-04-16 DIAGNOSIS — J3089 Other allergic rhinitis: Secondary | ICD-10-CM | POA: Diagnosis not present

## 2018-04-16 DIAGNOSIS — G43709 Chronic migraine without aura, not intractable, without status migrainosus: Secondary | ICD-10-CM | POA: Diagnosis not present

## 2018-04-16 DIAGNOSIS — K219 Gastro-esophageal reflux disease without esophagitis: Secondary | ICD-10-CM

## 2018-04-16 DIAGNOSIS — G43909 Migraine, unspecified, not intractable, without status migrainosus: Secondary | ICD-10-CM | POA: Insufficient documentation

## 2018-04-16 DIAGNOSIS — Z23 Encounter for immunization: Secondary | ICD-10-CM

## 2018-04-16 NOTE — Progress Notes (Signed)
Subjective:    Patient ID: Anthony Ochoa, male    DOB: [birth year], 32 y.o.   MRN: [id number]  HPI  Anthony Ochoa is a 32 year old male who presents today to establish care and discuss the problems mentioned below. Will obtain old records. He has had indirect contact with a student at [employer] who has symptoms of mumps (no confirmation). He denies fevers, chills, cough, rash.   1) Seasonal Allergies:During most every seasonal changes. Currently managed on Zyrtec, Flonase, albuterol inhaler. Rare use of inhaler, no recent use in one year.   2) Migraines: Stress induced and typically located to the parietal lobe with movement to bilateral frontal lobes. Currently managed on rizatriptan 10 mg. Experiences migraines every several months with last migraine being in May 2019. He thinks his migraines were stress induced from a prior stressful job. He does experience photophobia, some nausea during migraines.   He limits caffeine and has since switched jobs.   3) GERD: Intermittent with spicy/irritative foods initially, but has recently noticed symptoms with bland food. He will notice esophageal burning with painful swallowing mostly, this will last for a few minutes after eating. He's taking Tums and Mylanta for the most part with resolve in symptoms. He denies abdominal pain, nausea, unexplained weight loss.   BP Readings from Last 3 Encounters:  04/16/18 124/84  03/20/18 124/80  12/23/17 119/67     Review of Systems  Constitutional: Negative for fever.  Respiratory: Negative for cough and shortness of breath.   Cardiovascular: Negative for chest pain.  Gastrointestinal: Negative for abdominal pain and nausea.       GERD  Skin: Negative for rash.  Allergic/Immunologic: Positive for environmental allergies.       Past Medical History:  Diagnosis Date  . Allergy   . Chickenpox   . Croup   . GERD (gastroesophageal reflux disease)   . Migraines      Social History   Socioeconomic  History  . Marital status: Married    Spouse name: Not on file  . Number of children: Not on file  . Years of education: Not on file  . Highest education level: Not on file  Occupational History  . Not on file  Social Needs  . Financial resource strain: Not on file  . Food insecurity:    Worry: Not on file    Inability: Not on file  . Transportation needs:    Medical: Not on file    Non-medical: Not on file  Tobacco Use  . Smoking status: Never Smoker  . Smokeless tobacco: Never Used  Substance and Sexual Activity  . Alcohol use: Yes  . Drug use: Never  . Sexual activity: Not on file  Lifestyle  . Physical activity:    Days per week: Not on file    Minutes per session: Not on file  . Stress: Not on file  Relationships  . Social connections:    Talks on phone: Not on file    Gets together: Not on file    Attends religious service: Not on file    Active member of club or organization: Not on file    Attends meetings of clubs or organizations: Not on file    Relationship status: Not on file  . Intimate partner violence:    Fear of current or ex partner: Not on file    Emotionally abused: Not on file    Physically abused: Not on file    Forced sexual activity: Not  on file  Other Topics Concern  . Not on file  Social History Narrative   Married.   2 children.   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     Past Surgical History:  Procedure Laterality Date  . TONSILLECTOMY  2002    Family History  Problem Relation Age of Onset  . Miscarriages / [place] Mother   . Alcohol abuse Father   . Hyperlipidemia Maternal Grandmother   . Hypertension Maternal Grandmother   . Heart disease Maternal Grandfather   . Hyperlipidemia Maternal Grandfather   . Hypertension Maternal Grandfather   . Stroke Maternal Grandfather     Allergies  Allergen Reactions  . Penicillins Rash    Current Outpatient Medications on File Prior to Visit  Medication Sig Dispense Refill  .  cetirizine (ZYRTEC) 10 MG tablet Take 10 mg by mouth daily.    . fluticasone (FLONASE) 50 MCG/ACT nasal spray Place into both nostrils daily.    Marland Kitchen albuterol (PROVENTIL HFA;VENTOLIN HFA) 108 (90 Base) MCG/ACT inhaler Inhale into the lungs every 6 (six) hours as needed for wheezing or shortness of breath.    . rizatriptan (MAXALT) 10 MG tablet Take 1 tablet (10 mg total) by mouth as needed for migraine. May repeat in 2 hours if needed (Patient not taking: Reported on 04/16/2018) 10 tablet 0   No current facility-administered medications on file prior to visit.     BP 124/84   Pulse 61   Temp 98.5 F (36.9 C) (Oral)   Ht 5\' 9"  (1.753 m)   Wt 243 lb 8 oz (110.5 kg)   SpO2 98%   BMI 35.96 kg/m    Objective:   Physical Exam  Constitutional: He appears well-nourished. He does not appear ill.  HENT:  Mouth/Throat: Oropharynx is clear and moist.  Neck: Neck supple.  Cardiovascular: Normal rate and regular rhythm.  Respiratory: Effort normal and breath sounds normal.  Lymphadenopathy:    He has no cervical adenopathy.  Skin: Skin is warm and dry.  Psychiatric: He has a normal mood and affect.           Assessment & Plan:

## 2018-04-16 NOTE — Assessment & Plan Note (Signed)
Intermittent. Improved with Tums and Mylanta. Discussed trigger foods, handout provided. Discussed use of Zantac if needed.

## 2018-04-16 NOTE — Assessment & Plan Note (Signed)
Infrequent now since occupational changes. Using Maxalt infrequently. Continue to monitor.

## 2018-04-16 NOTE — Addendum Note (Signed)
Addended by: Tawnya Crook on: 04/16/2018 02:13 PM   Modules accepted: Orders

## 2018-04-16 NOTE — Patient Instructions (Signed)
You can try taking ranitidine (Zantac) or famotidine (Pepcid) daily for several weeks for recurrent acid reflux. It's okay to use Tums or Mylanta as needed.  Take a look at the trigger food for GERD.  Please schedule a physical with me anytime at your convenience. You may also schedule a lab only appointment 3-4 days prior. We will discuss your lab results in detail during your physical.  It was a pleasure to meet you today! Please don't hesitate to call or message me with any questions. Welcome to [facility]!   Food Choices for Gastroesophageal Reflux Disease, Adult When you have gastroesophageal reflux disease (GERD), the foods you eat and your eating habits are very important. Choosing the right foods can help ease your discomfort. What guidelines do I need to follow?  Choose fruits, vegetables, whole grains, and low-fat dairy products.  Choose low-fat meat, fish, and poultry.  Limit fats such as oils, salad dressings, butter, nuts, and avocado.  Keep a food diary. This helps you identify foods that cause symptoms.  Avoid foods that cause symptoms. These may be different for everyone.  Eat small meals often instead of 3 large meals a day.  Eat your meals slowly, in a place where you are relaxed.  Limit fried foods.  Cook foods using methods other than frying.  Avoid drinking alcohol.  Avoid drinking large amounts of liquids with your meals.  Avoid bending over or lying down until 2-3 hours after eating. What foods are not recommended? These are some foods and drinks that may make your symptoms worse: Vegetables Tomatoes. Tomato juice. Tomato and spaghetti sauce. Chili peppers. Onion and garlic. Horseradish. Fruits Oranges, grapefruit, and lemon (fruit and juice). Meats High-fat meats, fish, and poultry. This includes hot dogs, ribs, ham, sausage, salami, and bacon. Dairy Whole milk and chocolate milk. Sour cream. Cream. Butter. Ice cream. Cream cheese. Drinks Coffee  and tea. Bubbly (carbonated) drinks or energy drinks. Condiments Hot sauce. Barbecue sauce. Sweets/Desserts Chocolate and cocoa. Donuts. Peppermint and spearmint. Fats and Oils High-fat foods. This includes [place] fries and potato chips. Other Vinegar. Strong spices. This includes black pepper, white pepper, red pepper, cayenne, curry powder, cloves, ginger, and chili powder. The items listed above may not be a complete list of foods and drinks to avoid. Contact your dietitian for more information. This information is not intended to replace advice given to you by your health care provider. Make sure you discuss any questions you have with your health care provider. Document Released: 01/07/2012 Document Revised: 12/14/2015 Document Reviewed: 05/12/2013 Elsevier Interactive Patient Education  2017 [employer].

## 2018-04-16 NOTE — Assessment & Plan Note (Signed)
Intermittent during all seasonal changes. Doing well on current regimen, continue same.

## 2018-04-23 DIAGNOSIS — H1045 Other chronic allergic conjunctivitis: Secondary | ICD-10-CM | POA: Diagnosis not present

## 2018-04-23 DIAGNOSIS — R05 Cough: Secondary | ICD-10-CM | POA: Diagnosis not present

## 2018-04-23 DIAGNOSIS — J3089 Other allergic rhinitis: Secondary | ICD-10-CM | POA: Diagnosis not present

## 2018-04-23 DIAGNOSIS — J301 Allergic rhinitis due to pollen: Secondary | ICD-10-CM | POA: Diagnosis not present

## 2018-06-16 ENCOUNTER — Other Ambulatory Visit: Payer: Self-pay | Admitting: Primary Care

## 2018-06-16 DIAGNOSIS — Z0000 Encounter for general adult medical examination without abnormal findings: Secondary | ICD-10-CM

## 2018-06-22 ENCOUNTER — Other Ambulatory Visit: Payer: BLUE CROSS/BLUE SHIELD

## 2018-06-24 ENCOUNTER — Other Ambulatory Visit: Payer: Self-pay | Admitting: Primary Care

## 2018-06-24 ENCOUNTER — Ambulatory Visit (INDEPENDENT_AMBULATORY_CARE_PROVIDER_SITE_OTHER): Payer: BLUE CROSS/BLUE SHIELD | Admitting: Primary Care

## 2018-06-24 ENCOUNTER — Encounter: Payer: Self-pay | Admitting: Primary Care

## 2018-06-24 VITALS — BP 118/74 | HR 60 | Temp 98.2°F | Ht 69.0 in | Wt 249.5 lb

## 2018-06-24 DIAGNOSIS — Z0001 Encounter for general adult medical examination with abnormal findings: Secondary | ICD-10-CM | POA: Insufficient documentation

## 2018-06-24 DIAGNOSIS — G43709 Chronic migraine without aura, not intractable, without status migrainosus: Secondary | ICD-10-CM

## 2018-06-24 DIAGNOSIS — Z0000 Encounter for general adult medical examination without abnormal findings: Secondary | ICD-10-CM | POA: Diagnosis not present

## 2018-06-24 DIAGNOSIS — K219 Gastro-esophageal reflux disease without esophagitis: Secondary | ICD-10-CM

## 2018-06-24 DIAGNOSIS — J3089 Other allergic rhinitis: Secondary | ICD-10-CM

## 2018-06-24 DIAGNOSIS — E781 Pure hyperglyceridemia: Secondary | ICD-10-CM

## 2018-06-24 LAB — COMPREHENSIVE METABOLIC PANEL
ALBUMIN: 4.3 g/dL (ref 3.5–5.2)
ALT: 34 U/L (ref 0–53)
AST: 23 U/L (ref 0–37)
Alkaline Phosphatase: 73 U/L (ref 39–117)
BUN: 11 mg/dL (ref 6–23)
CHLORIDE: 102 meq/L (ref 96–112)
CO2: 27 meq/L (ref 19–32)
CREATININE: 0.99 mg/dL (ref 0.40–1.50)
Calcium: 9.2 mg/dL (ref 8.4–10.5)
GFR: 92.12 mL/min (ref >60.00)
Glucose, Bld: 98 mg/dL (ref 70–99)
POTASSIUM: 4.1 meq/L (ref 3.5–5.1)
SODIUM: 136 meq/L (ref 135–145)
Total Bilirubin: 0.7 mg/dL (ref 0.2–1.2)
Total Protein: 7.6 g/dL (ref 6.0–8.3)

## 2018-06-24 LAB — LIPID PANEL
CHOL/HDL RATIO: 5
Cholesterol: 184 mg/dL (ref 0–200)
HDL: 39.3 mg/dL (ref >39.00)
NONHDL: 144.74
Triglycerides: 300 mg/dL — ABNORMAL HIGH (ref 0.0–149.0)
VLDL: 60 mg/dL — AB (ref 0.0–40.0)

## 2018-06-24 LAB — LDL CHOLESTEROL, DIRECT: Direct LDL: 102 mg/dL

## 2018-06-24 NOTE — Assessment & Plan Note (Signed)
No migraine since May 2019. More stress related. No recent use of Maxalt. Once on Imitrex which was ineffective. Continue current regimen.

## 2018-06-24 NOTE — Progress Notes (Signed)
Subjective:    Patient ID: Anthony Ochoa, male    DOB: [birth year], 32 y.o.   MRN: [id number]  HPI  Anthony Ochoa is a 32 year old male who presents today for complete physical.  Immunizations: -Tetanus: Completed in 2018 -Influenza: Completed this season   Diet: He endorses a fair diet. Breakfast: Bagel, muffin, cereal  Lunch: Fast food, sandwiches, salads, left overs  Dinner: Meat, vegetables, starch; take out Snacks: Sometimes fruit, peanut butter crackers  Desserts: 4-5 times weekly  Beverages: Hot tea  Exercise: He is not exercising regularly, does play soccer once weekly, walks 8-12000 steps daily. Eye exam: Completed in 2019 Dental exam: Completes semi-annually   BP Readings from Last 3 Encounters:  06/24/18 118/74  04/16/18 124/84  03/20/18 124/80     Review of Systems  Constitutional: Negative for unexpected weight change.  HENT: Negative for rhinorrhea.   Respiratory: Negative for cough and shortness of breath.   Cardiovascular: Negative for chest pain.  Gastrointestinal: Negative for constipation and diarrhea.  Genitourinary: Negative for difficulty urinating.  Musculoskeletal: Negative for arthralgias and myalgias.  Skin: Negative for rash.  Allergic/Immunologic: Negative for environmental allergies.  Neurological: Negative for dizziness, numbness and headaches.  Psychiatric/Behavioral:       Some stress with work, overall able to manage.       Past Medical History:  Diagnosis Date  . Allergy   . Chickenpox   . Croup   . GERD (gastroesophageal reflux disease)   . Migraines      Social History   Socioeconomic History  . Marital status: Married    Spouse name: Not on file  . Number of children: Not on file  . Years of education: Not on file  . Highest education level: Not on file  Occupational History  . Not on file  Social Needs  . Financial resource strain: Not on file  . Food insecurity:    Worry: Not on file    Inability: Not on file   . Transportation needs:    Medical: Not on file    Non-medical: Not on file  Tobacco Use  . Smoking status: Never Smoker  . Smokeless tobacco: Never Used  Substance and Sexual Activity  . Alcohol use: Yes  . Drug use: Never  . Sexual activity: Not on file  Lifestyle  . Physical activity:    Days per week: Not on file    Minutes per session: Not on file  . Stress: Not on file  Relationships  . Social connections:    Talks on phone: Not on file    Gets together: Not on file    Attends religious service: Not on file    Active member of club or organization: Not on file    Attends meetings of clubs or organizations: Not on file    Relationship status: Not on file  . Intimate partner violence:    Fear of current or ex partner: Not on file    Emotionally abused: Not on file    Physically abused: Not on file    Forced sexual activity: Not on file  Other Topics Concern  . Not on file  Social History Narrative   Married.   2 children.   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     Past Surgical History:  Procedure Laterality Date  . TONSILLECTOMY  2002    Family History  Problem Relation Age of Onset  . Miscarriages / [place] Mother   . Alcohol abuse Father   .  Hyperlipidemia Maternal Grandmother   . Hypertension Maternal Grandmother   . Heart disease Maternal Grandfather   . Hyperlipidemia Maternal Grandfather   . Hypertension Maternal Grandfather   . Stroke Maternal Grandfather     Allergies  Allergen Reactions  . Penicillins Rash    Current Outpatient Medications on File Prior to Visit  Medication Sig Dispense Refill  . albuterol (PROVENTIL HFA;VENTOLIN HFA) 108 (90 Base) MCG/ACT inhaler Inhale into the lungs every 6 (six) hours as needed for wheezing or shortness of breath.    . fluticasone (FLONASE) 50 MCG/ACT nasal spray Place into both nostrils daily.    Marland Kitchen levocetirizine (XYZAL) 5 MG tablet TK 1 T PO QD IN THE EVE  3  . montelukast (SINGULAIR) 10 MG tablet  TK 1 T PO QD  3  . rizatriptan (MAXALT) 10 MG tablet Take 1 tablet (10 mg total) by mouth as needed for migraine. May repeat in 2 hours if needed (Patient not taking: Reported on 04/16/2018) 10 tablet 0   No current facility-administered medications on file prior to visit.     BP 118/74   Pulse 60   Temp 98.2 F (36.8 C) (Oral)   Ht 5\' 9"  (1.753 m)   Wt 249 lb 8 oz (113.2 kg)   SpO2 98%   BMI 36.84 kg/m    Objective:   Physical Exam  Constitutional: He is oriented to person, place, and time. He appears well-nourished.  HENT:  Mouth/Throat: No oropharyngeal exudate.  Eyes: Pupils are equal, round, and reactive to light. EOM are normal.  Neck: Neck supple. No thyromegaly present.  Cardiovascular: Normal rate and regular rhythm.  Respiratory: Effort normal and breath sounds normal.  GI: Soft. Bowel sounds are normal. There is no tenderness.  Musculoskeletal: Normal range of motion.  Neurological: He is alert and oriented to person, place, and time.  Skin: Skin is warm and dry.  Psychiatric: He has a normal mood and affect.           Assessment & Plan:

## 2018-06-24 NOTE — Assessment & Plan Note (Signed)
Immunizations UTD. Encouraged regular exercise, healthy diet. Exam unremarkable. Labs pending. Follow up in 1 year for CPE. 

## 2018-06-24 NOTE — Assessment & Plan Note (Signed)
Compliant to Xyzal nightly, using Singulair PRN during seasonal changes. Using Flonase PRN. Continue current regimen.

## 2018-06-24 NOTE — Patient Instructions (Signed)
Stop by the lab prior to leaving today. I will notify you of your results once received.   Start exercising. You should be getting 150 minutes of moderate intensity exercise weekly.  It's important to improve your diet by reducing consumption of fast food, fried food, processed snack foods, sugary drinks. Increase consumption of fresh vegetables and fruits, whole grains, water.  Ensure you are drinking 64 ounces of water daily.  Consider trying famotidine 20 mg daily if needed for daily heartburn symptoms.  It was a pleasure to see you today!   Preventive Care 18-39 Years, Male Preventive care refers to lifestyle choices and visits with your health care provider that can promote health and wellness. What does preventive care include?  A yearly physical exam. This is also called an annual well check.  Dental exams once or twice a year.  Routine eye exams. Ask your health care provider how often you should have your eyes checked.  Personal lifestyle choices, including: ? Daily care of your teeth and gums. ? Regular physical activity. ? Eating a healthy diet. ? Avoiding tobacco and drug use. ? Limiting alcohol use. ? Practicing safe sex. What happens during an annual well check? The services and screenings done by your health care provider during your annual well check will depend on your age, overall health, lifestyle risk factors, and family history of disease. Counseling Your health care provider may ask you questions about your:  Alcohol use.  Tobacco use.  Drug use.  Emotional well-being.  Home and relationship well-being.  Sexual activity.  Eating habits.  Work and work Statistician.  Screening You may have the following tests or measurements:  Height, weight, and BMI.  Blood pressure.  Lipid and cholesterol levels. These may be checked every 5 years starting at age 12.  Diabetes screening. This is done by checking your blood sugar (glucose) after you  have not eaten for a while (fasting).  Skin check.  Hepatitis C blood test.  Hepatitis B blood test.  Sexually transmitted disease (STD) testing.  Discuss your test results, treatment options, and if necessary, the need for more tests with your health care provider. Vaccines Your health care provider may recommend certain vaccines, such as:  Influenza vaccine. This is recommended every year.  Tetanus, diphtheria, and acellular pertussis (Tdap, Td) vaccine. You may need a Td booster every 10 years.  Varicella vaccine. You may need this if you have not been vaccinated.  HPV vaccine. If you are 25 or younger, you may need three doses over 6 months.  Measles, mumps, and rubella (MMR) vaccine. You may need at least one dose of MMR.You may also need a second dose.  Pneumococcal 13-valent conjugate (PCV13) vaccine. You may need this if you have certain conditions and have not been vaccinated.  Pneumococcal polysaccharide (PPSV23) vaccine. You may need one or two doses if you smoke cigarettes or if you have certain conditions.  Meningococcal vaccine. One dose is recommended if you are age 47-21 years and a first-year college student living in a residence hall, or if you have one of several medical conditions. You may also need additional booster doses.  Hepatitis A vaccine. You may need this if you have certain conditions or if you travel or work in places where you may be exposed to hepatitis A.  Hepatitis B vaccine. You may need this if you have certain conditions or if you travel or work in places where you may be exposed to hepatitis B.  Haemophilus  influenzae type b (Hib) vaccine. You may need this if you have certain risk factors.  Talk to your health care provider about which screenings and vaccines you need and how often you need them. This information is not intended to replace advice given to you by your health care provider. Make sure you discuss any questions you have with  your health care provider. Document Released: 09/03/2001 Document Revised: 03/27/2016 Document Reviewed: 05/09/2015 Elsevier Interactive Patient Education  [employer].

## 2018-06-24 NOTE — Assessment & Plan Note (Addendum)
Intermittent symptoms, recent more frequently. Continue Tums as needed, but discussed to start famotidine daily if symptoms occur more than 2 times weekly.

## 2018-07-20 ENCOUNTER — Encounter: Payer: Self-pay | Admitting: Adult Health

## 2018-07-20 ENCOUNTER — Ambulatory Visit: Payer: Self-pay | Admitting: Adult Health

## 2018-07-20 VITALS — BP 133/63 | HR 72 | Temp 97.9°F | Ht 70.0 in | Wt 243.0 lb

## 2018-07-20 DIAGNOSIS — H66002 Acute suppurative otitis media without spontaneous rupture of ear drum, left ear: Secondary | ICD-10-CM

## 2018-07-20 DIAGNOSIS — J069 Acute upper respiratory infection, unspecified: Secondary | ICD-10-CM

## 2018-07-20 MED ORDER — AZITHROMYCIN 250 MG PO TABS: ORAL_TABLET | ORAL | 0 refills | Status: DC

## 2018-07-20 NOTE — Patient Instructions (Signed)
Otitis Media, Adult  Otitis media means that the middle ear is red and swollen (inflamed) and full of fluid. The condition usually goes away on its own. Follow these instructions at home:  Take over-the-counter and prescription medicines only as told by your doctor.  If you were prescribed an antibiotic medicine, take it as told by your doctor. Do not stop taking the antibiotic even if you start to feel better.  Keep all follow-up visits as told by your doctor. This is important. Contact a doctor if:  You have bleeding from your nose.  There is a lump on your neck.  You are not getting better in 5 days.  You feel worse instead of better. Get help right away if:  You have pain that is not helped with medicine.  You have swelling, redness, or pain around your ear.  You get a stiff neck.  You cannot move part of your face (paralyzed).  You notice that the bone behind your ear hurts when you touch it.  You get a very bad headache. Summary  Otitis media means that the middle ear is red, swollen, and full of fluid.  This condition usually goes away on its own. In some cases, treatment may be needed.  If you were prescribed an antibiotic medicine, take it as told by your doctor. This information is not intended to replace advice given to you by your health care provider. Make sure you discuss any questions you have with your health care provider. Document Released: 12/25/2007 Document Revised: 07/29/2016 Document Reviewed: 07/29/2016 Elsevier Interactive Patient Education  2019 [employer] Upper Respiratory Infection, Adult An upper respiratory infection (URI) affects the nose, throat, and upper air passages. URIs are caused by germs (viruses). The most common type of URI is often called "the common cold." Medicines cannot cure URIs, but you can do things at home to relieve your symptoms. URIs usually get better within 7-10 days. Follow these instructions at  home: Activity  Rest as needed.  If you have a fever, stay home from work or school until your fever is gone, or until your doctor says you may return to work or school. ? You should stay home until you cannot spread the infection anymore (you are not contagious). ? Your doctor may have you wear a face mask so you have less risk of spreading the infection. Relieving symptoms  Gargle with a salt-water mixture 3-4 times a day or as needed. To make a salt-water mixture, completely dissolve -1 tsp of salt in 1 cup of warm water.  Use a cool-mist humidifier to add moisture to the air. This can help you breathe more easily. Eating and drinking   Drink enough fluid to keep your pee (urine) pale yellow.  Eat soups and other clear broths. General instructions   Take over-the-counter and prescription medicines only as told by your doctor. These include cold medicines, fever reducers, and cough suppressants.  Do not use any products that contain nicotine or tobacco. These include cigarettes and e-cigarettes. If you need help quitting, ask your doctor.  Avoid being where people are smoking (avoid secondhand smoke).  Make sure you get regular shots and get the flu shot every year.  Keep all follow-up visits as told by your doctor. This is important. How to avoid spreading infection to others   Wash your hands often with soap and water. If you do not have soap and water, use hand sanitizer.  Avoid touching your mouth, face, eyes, or  nose.  Cough or sneeze into a tissue or your sleeve or elbow. Do not cough or sneeze into your hand or into the air. Contact a doctor if:  You are getting worse, not better.  You have any of these: ? A fever. ? Chills. ? Brown or red mucus in your nose. ? Yellow or brown fluid (discharge)coming from your nose. ? Pain in your face, especially when you bend forward. ? Swollen neck glands. ? Pain with swallowing. ? White areas in the back of your  throat. Get help right away if:  You have shortness of breath that gets worse.  You have very bad or constant: ? Headache. ? Ear pain. ? Pain in your forehead, behind your eyes, and over your cheekbones (sinus pain). ? Chest pain.  You have long-lasting (chronic) lung disease along with any of these: ? Wheezing. ? Long-lasting cough. ? Coughing up blood. ? A change in your usual mucus.  You have a stiff neck.  You have changes in your: ? Vision. ? Hearing. ? Thinking. ? Mood. Summary  An upper respiratory infection (URI) is caused by a germ called a virus. The most common type of URI is often called "the common cold."  URIs usually get better within 7-10 days.  Take over-the-counter and prescription medicines only as told by your doctor. This information is not intended to replace advice given to you by your health care provider. Make sure you discuss any questions you have with your health care provider. Document Released: 12/25/2007 Document Revised: 02/28/2017 Document Reviewed: 02/28/2017 Elsevier Interactive Patient Education  2019 [employer]

## 2018-07-20 NOTE — Progress Notes (Addendum)
Subjective:     Patient ID: Anthony Ochoa, male   DOB: 15-Feb-1986, 32 y.o.   MRN: [id number]   Blood pressure 133/63, pulse 72, temperature 97.9 F (36.6 C), height 5\' 10"  (1.778 m), weight 243 lb (110.2 kg), SpO2 96 %.  HPI Patient is a 32 year old male in no acute distress who comes to the clinic for complaints of sore throat since Friday 07/17/18. Mild nasal congestion . Green/ yellow nasal mucous.   Bilateral ear pain.    Allergies  Allergen Reactions  . Penicillins Rash    Review of Systems  Constitutional: Positive for fatigue. Negative for activity change, appetite change, chills, diaphoresis, fever and unexpected weight change.  HENT: Positive for congestion, ear pain, postnasal drip, rhinorrhea and sore throat. Negative for dental problem, drooling, ear discharge, facial swelling, hearing loss, mouth sores, nosebleeds, sinus pressure, sinus pain, sneezing, tinnitus, trouble swallowing and voice change.   Eyes: Negative.   Respiratory: Negative.  Negative for apnea, cough, choking, chest tightness, shortness of breath, wheezing and stridor.   Cardiovascular: Negative.  Negative for palpitations and leg swelling.  Gastrointestinal: Negative.   Genitourinary: Negative.   Musculoskeletal: Negative.   Neurological: Negative.   Hematological: Negative.   Psychiatric/Behavioral: Negative.        Objective:   Physical Exam Vitals signs and nursing note reviewed.  Constitutional:      General: He is not in acute distress.    Appearance: He is well-developed. He is not ill-appearing, toxic-appearing or diaphoretic.  HENT:     Head: Normocephalic and atraumatic.     Salivary Glands: Right salivary gland is not diffusely enlarged or tender. Left salivary gland is not diffusely enlarged or tender.     Right Ear: Tympanic membrane and ear canal normal. No decreased hearing noted. No drainage, swelling or tenderness. No middle ear effusion. Tympanic membrane is not perforated or  erythematous.     Left Ear: No decreased hearing noted. No drainage, swelling or tenderness. A middle ear effusion is present. Tympanic membrane is erythematous and bulging. Tympanic membrane is not perforated or retracted. Tympanic membrane has normal mobility.     Nose: Congestion and rhinorrhea present.     Mouth/Throat:     Mouth: Mucous membranes are moist. No oral lesions.     Pharynx: Uvula midline. Posterior oropharyngeal erythema present. No pharyngeal swelling, oropharyngeal exudate or uvula swelling.     Tonsils: No tonsillar exudate. Swelling: 0 on the right. 0 on the left.  Eyes:     Conjunctiva/sclera: Conjunctivae normal.     Pupils: Pupils are equal, round, and reactive to light.  Neck:     Musculoskeletal: Full passive range of motion without pain, normal range of motion and neck supple.     Thyroid: No thyromegaly.     Trachea: Trachea and phonation normal.  Cardiovascular:     Rate and Rhythm: Normal rate and regular rhythm.     Heart sounds: No murmur. No friction rub. No gallop.   Pulmonary:     Effort: Pulmonary effort is normal. No respiratory distress.     Breath sounds: Normal breath sounds. No stridor. No wheezing, rhonchi or rales.  Chest:     Chest wall: No tenderness.  Abdominal:     Palpations: Abdomen is soft.  Lymphadenopathy:     Head:     Right side of head: No submental, submandibular, tonsillar, preauricular, posterior auricular or occipital adenopathy.     Left side of head: No submental, submandibular,  tonsillar, preauricular, posterior auricular or occipital adenopathy.     Cervical: No cervical adenopathy.     Right cervical: No superficial, deep or posterior cervical adenopathy.    Left cervical: No superficial, deep or posterior cervical adenopathy.  Skin:    General: Skin is warm and dry.     Capillary Refill: Capillary refill takes less than 2 seconds.     Nails: There is no clubbing.   Neurological:     General: No focal deficit  present.     Mental Status: He is alert.     Coordination: Coordination is intact.  Psychiatric:        Attention and Perception: Attention normal.        Mood and Affect: Mood normal.        Speech: Speech normal.        Behavior: Behavior normal.        Thought Content: Thought content normal.        Cognition and Memory: Cognition normal.        Judgment: Judgment normal.        Assessment:     Non-recurrent acute suppurative otitis media of left ear without spontaneous rupture of tympanic membrane  Viral upper respiratory tract infection      Plan:     Meds ordered this encounter  Medications  . azithromycin (ZITHROMAX) 250 MG tablet    Sig: By mouth Take 2 tablets day 1 (500mg  total) and 1 tablet ( 250 mg ) on days 2,3,4,5.    Dispense:  6 tablet    Refill:  0   Gave and reviewed After Visit Summary(AVS) with patient.   Advised patient call the office or your primary care doctor for an appointment if no improvement within 72 hours or if any symptoms change or worsen at any time  Advised ER or urgent Care if after hours or on weekend. Call 911 for emergency symptoms at any time.Patinet verbalized understanding of all instructions given/reviewed and treatment plan and has no further questions or concerns at this time.    Patient verbalized understanding of all instructions given and denies any further questions at this time.

## 2018-07-29 ENCOUNTER — Encounter: Payer: Self-pay | Admitting: Medical

## 2018-07-29 ENCOUNTER — Ambulatory Visit: Payer: Self-pay | Admitting: Medical

## 2018-07-29 ENCOUNTER — Ambulatory Visit: Payer: Self-pay | Admitting: Registered Nurse

## 2018-07-29 VITALS — BP 129/70 | HR 84 | Temp 97.9°F | Resp 18 | Wt 247.8 lb

## 2018-07-29 DIAGNOSIS — J069 Acute upper respiratory infection, unspecified: Secondary | ICD-10-CM

## 2018-07-29 DIAGNOSIS — B9789 Other viral agents as the cause of diseases classified elsewhere: Principal | ICD-10-CM

## 2018-07-29 DIAGNOSIS — H6983 Other specified disorders of Eustachian tube, bilateral: Secondary | ICD-10-CM

## 2018-07-29 MED ORDER — DEXTROMETHORPHAN POLISTIREX ER 30 MG/5ML PO SUER: 30.0000 mg | ORAL | 0 refills | Status: AC | PRN

## 2018-07-29 NOTE — Progress Notes (Signed)
Subjective:    Patient ID: Anthony Ochoa, male    DOB: 07/21/1986, 33 y.o.   MRN: [id number]  32y/o established married male patient here for bilateral ear pain, rhinitis, headache; flonase/neti pot and xyzal his usual regimen.  Cough drops prn cough.  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  Spouse and children have been sick with similar symptoms traveled for work.  Was diagnosed with ear infection last week finished zpak and had been feeling better now worse again with PND/rhinitis and mild cough.  Cough not bothering him so much as drip and nasal/sinus congestion.  Took the day off from work today.     Review of Systems  Constitutional: Positive for fatigue. Negative for activity change, appetite change, chills, diaphoresis, fever and unexpected weight change.  HENT: Positive for congestion, ear pain, postnasal drip, rhinorrhea, sinus pressure, sinus pain and sore throat. Negative for dental problem, drooling, ear discharge, facial swelling, hearing loss, mouth sores, nosebleeds, sneezing, tinnitus, trouble swallowing and voice change.   Eyes: Negative for photophobia, pain, discharge, redness, itching and visual disturbance.  Respiratory: Positive for cough. Negative for choking, chest tightness, shortness of breath, wheezing and stridor.   Cardiovascular: Negative for chest pain, palpitations and leg swelling.  Gastrointestinal: Negative for abdominal distention, abdominal pain, blood in stool, constipation, diarrhea, nausea and vomiting.  Endocrine: Negative for cold intolerance and heat intolerance.  Genitourinary: Negative for dysuria.  Musculoskeletal: Negative for arthralgias, back pain, gait problem, joint swelling, myalgias, neck pain and neck stiffness.  Skin: Negative for color change, pallor, rash and wound.  Allergic/Immunologic: Positive for environmental allergies. Negative for food allergies and immunocompromised state.  Neurological: Negative for dizziness, tremors, seizures, syncope,  facial asymmetry, speech difficulty, weakness, light-headedness, numbness and headaches.  Hematological: Negative for adenopathy. Does not bruise/bleed easily.  Psychiatric/Behavioral: Negative for agitation, behavioral problems, confusion and sleep disturbance.       Objective:   Physical Exam Vitals signs and nursing note reviewed.  Constitutional:      General: He is not in acute distress.    Appearance: Normal appearance. He is well-developed and well-groomed. He is obese. He is ill-appearing. He is not toxic-appearing or diaphoretic.  HENT:     Head: Normocephalic and atraumatic.     Jaw: There is normal jaw occlusion. No trismus.     Salivary Glands: Right salivary gland is not diffusely enlarged or tender. Left salivary gland is not diffusely enlarged or tender.     Right Ear: Hearing, ear canal and external ear normal. A middle ear effusion is present.     Left Ear: Hearing, ear canal and external ear normal. A middle ear effusion is present.     Nose: Mucosal edema, congestion and rhinorrhea present. No nasal deformity, septal deviation, laceration or nasal tenderness.     Right Nostril: No epistaxis or occlusion.     Left Nostril: No epistaxis or occlusion.     Right Turbinates: Enlarged and swollen. Not pale.     Left Turbinates: Enlarged and swollen. Not pale.     Right Sinus: No maxillary sinus tenderness or frontal sinus tenderness.     Left Sinus: No maxillary sinus tenderness or frontal sinus tenderness.     Mouth/Throat:     Lips: Pink. No lesions.     Mouth: Mucous membranes are moist. Mucous membranes are not pale, not dry and not cyanotic. No injury, lacerations, oral lesions or angioedema.     Dentition: Normal dentition. Does not have dentures. No dental caries or  dental abscesses.     Tongue: No lesions.     Pharynx: Uvula midline. Pharyngeal swelling and posterior oropharyngeal erythema present. No oropharyngeal exudate or uvula swelling.     Tonsils: No  tonsillar exudate or tonsillar abscesses. Swelling: 0 on the right. 0 on the left.     Comments: Cobblestoning posterior pharynx; bilateral TMs air fluid level clear; bilateral allergic shiners; clear discharge bilateral nasal frequent nasal clearing in exam room; nasal turbinates edema erythema  Eyes:     General: Lids are normal. Allergic shiner present. No scleral icterus.       Right eye: No foreign body, discharge or hordeolum.        Left eye: No foreign body, discharge or hordeolum.     Extraocular Movements: Extraocular movements intact.     Right eye: Normal extraocular motion and no nystagmus.     Left eye: Normal extraocular motion and no nystagmus.     Conjunctiva/sclera: Conjunctivae normal.     Right eye: Right conjunctiva is not injected. No chemosis, exudate or hemorrhage.    Left eye: Left conjunctiva is not injected. No chemosis, exudate or hemorrhage.    Pupils: Pupils are equal, round, and reactive to light. Pupils are equal.     Right eye: Pupil is round and reactive.     Left eye: Pupil is round and reactive.  Neck:     Musculoskeletal: Normal range of motion and neck supple. Normal range of motion. No edema, erythema, neck rigidity, spinous process tenderness or muscular tenderness.     Thyroid: No thyroid mass or thyromegaly.     Trachea: Trachea normal. No tracheal tenderness or tracheal deviation.  Cardiovascular:     Rate and Rhythm: Normal rate and regular rhythm.     Chest Wall: PMI is not displaced.     Heart sounds: Normal heart sounds, S1 normal and S2 normal. No murmur. No friction rub. No gallop.   Pulmonary:     Effort: Pulmonary effort is normal. No respiratory distress.     Breath sounds: Normal breath sounds and air entry. No stridor, decreased air movement or transmitted upper airway sounds. No decreased breath sounds, wheezing, rhonchi or rales.     Comments: Frequent nonproductive mild cough in exam room; spoke full sentences without  difficulty Abdominal:     General: There is no distension.     Palpations: Abdomen is soft.  Musculoskeletal: Normal range of motion.        General: No tenderness.     Right shoulder: Normal.     Left shoulder: Normal.     Right elbow: Normal.    Left elbow: Normal.     Right hip: Normal.     Left hip: Normal.     Right knee: Normal.     Left knee: Normal.     Cervical back: Normal.     Thoracic back: Normal.     Lumbar back: Normal.     Right hand: Normal.     Left hand: Normal.     Right lower leg: No edema.     Left lower leg: No edema.  Lymphadenopathy:     Head:     Right side of head: No submental, submandibular, tonsillar, preauricular, posterior auricular or occipital adenopathy.     Left side of head: No submental, submandibular, tonsillar, preauricular, posterior auricular or occipital adenopathy.     Cervical: No cervical adenopathy.     Right cervical: No superficial, deep or posterior cervical adenopathy.  Left cervical: No superficial, deep or posterior cervical adenopathy.  Skin:    General: Skin is warm and dry.     Capillary Refill: Capillary refill takes less than 2 seconds.     Coloration: Skin is not ashen, cyanotic, jaundiced, mottled, pale or sallow.     Findings: No abrasion, bruising, burn, ecchymosis, erythema, laceration, lesion, petechiae or rash.     Nails: There is no clubbing.   Neurological:     Mental Status: He is alert and oriented to person, place, and time.     GCS: GCS eye subscore is 4. GCS verbal subscore is 5. GCS motor subscore is 6.     Cranial Nerves: Cranial nerves are intact. No cranial nerve deficit, dysarthria or facial asymmetry.     Sensory: Sensation is intact. No sensory deficit.     Motor: Motor function is intact. No weakness, tremor, atrophy, abnormal muscle tone or seizure activity.     Coordination: Coordination is intact. Coordination normal.     Gait: Gait normal.     Comments: On/off exam table without  difficulty; gait sure and steady in hallway  Psychiatric:        Attention and Perception: Attention and perception normal.        Mood and Affect: Mood and affect normal.        Speech: Speech normal.        Behavior: Behavior normal. Behavior is cooperative.        Thought Content: Thought content normal.        Cognition and Memory: Cognition and memory normal.        Judgment: Judgment normal.           Assessment & Plan:  A-bilateral eustachian tube dysfunction, viral URI with cough  P-Supportive treatment.  Tylenol 1000mg  po QID prn pain.  Need to control postnasal drip and will take 30 days for middle ear fluid to clear once drip dried up. No evidence of invasive bacterial infection, non toxic and well hydrated.  This is most likely self limiting viral infection.  I do not see where any further testing or imaging is necessary at this time.   I will suggest supportive care, rest, good hygiene and encourage the patient to take adequate fluids.  The patient is to return to clinic or EMERGENCY ROOM if symptoms worsen or change significantly e.g. ear pain, fever, purulent discharge from ears or bleeding.  Exitcare handout on eustachian tube dysfunction printed and given to patient.  Patient verbalized agreement and understanding of treatment plan and had no further questions at this time    Patient may use normal saline nasal spray 2 sprays each nostril q2h wa as needed. flonase 1 spray each nostril BID at home.  Rx for delysm 30mg  po qhs prn cough and use mucinex DM am at home.  Hydrate to avoid dehydration as with cough and rhinitis will lose up to extra liter fluid per day.  Discussed honey with lemon, nondairy smoothies/popsicles, soup broths noncreamy and water.  Discussed this viral has been lasting 10-14 days in community should be clear to yellow to green clear mucous typically.  If opaque/fever/body aches/ear discharge notify clinic staff as flu also in community.  Patient  denied personal or family history of ENT cancer.  OTC antihistamine of choice xyzal po daily.  Avoid triggers if possible.  Shower prior to bedtime if exposed to triggers.  If allergic dust/dust mites recommend mattress/pillow covers/encasements; washing linens, vacuuming, sweeping, dusting  weekly.  Call or return to clinic as needed if these symptoms worsen or fail to improve as anticipated.   Exitcare handout on sinus rinse and viral URI, cough printed and given to patient.  Patient verbalized understanding of instructions, agreed with plan of care and had no further questions at this time.  P2:  Avoidance and hand washing.

## 2018-07-29 NOTE — Patient Instructions (Signed)
cou Cough, Adult  Coughing is a reflex that clears your throat and your airways. Coughing helps to heal and protect your lungs. It is normal to cough occasionally, but a cough that happens with other symptoms or lasts a long time may be a sign of a condition that needs treatment. A cough may last only 2-3 weeks (acute), or it may last longer than 8 weeks (chronic). What are the causes? Coughing is commonly caused by:  Breathing in substances that irritate your lungs.  A viral or bacterial respiratory infection.  Allergies.  Asthma.  Postnasal drip.  Smoking.  Acid backing up from the stomach into the esophagus (gastroesophageal reflux).  Certain medicines.  Chronic lung problems, including COPD (or rarely, lung cancer).  Other medical conditions such as heart failure. Follow these instructions at home: Pay attention to any changes in your symptoms. Take these actions to help with your discomfort:  Take medicines only as told by your health care provider. ? If you were prescribed an antibiotic medicine, take it as told by your health care provider. Do not stop taking the antibiotic even if you start to feel better. ? Talk with your health care provider before you take a cough suppressant medicine.  Drink enough fluid to keep your urine clear or pale yellow.  If the air is dry, use a cold steam vaporizer or humidifier in your bedroom or your home to help loosen secretions.  Avoid anything that causes you to cough at work or at home.  If your cough is worse at night, try sleeping in a semi-upright position.  Avoid cigarette smoke. If you smoke, quit smoking. If you need help quitting, ask your health care provider.  Avoid caffeine.  Avoid alcohol.  Rest as needed. Contact a health care provider if:  You have new symptoms.  You cough up pus.  Your cough does not get better after 2-3 weeks, or your cough gets worse.  You cannot control your cough with suppressant  medicines and you are losing sleep.  You develop pain that is getting worse or pain that is not controlled with pain medicines.  You have a fever.  You have unexplained weight loss.  You have night sweats. Get help right away if:  You cough up blood.  You have difficulty breathing.  Your heartbeat is very fast. This information is not intended to replace advice given to you by your health care provider. Make sure you discuss any questions you have with your health care provider. Document Released: 01/04/2011 Document Revised: 12/14/2015 Document Reviewed: 09/14/2014 Elsevier Interactive Patient Education  2019 [employer] Inc. Eustachian Tube Dysfunction  Eustachian tube dysfunction refers to a condition in which a blockage develops in the narrow passage that connects the middle ear to the back of the nose (eustachian tube). The eustachian tube regulates air pressure in the middle ear by letting air move between the ear and nose. It also helps to drain fluid from the middle ear space. Eustachian tube dysfunction can affect one or both ears. When the eustachian tube does not function properly, air pressure, fluid, or both can build up in the middle ear. What are the causes? This condition occurs when the eustachian tube becomes blocked or cannot open normally. Common causes of this condition include:  Ear infections.  Colds and other infections that affect the nose, mouth, and throat (upper respiratory tract).  Allergies.  Irritation from cigarette smoke.  Irritation from stomach acid coming up into the esophagus (gastroesophageal reflux).  The esophagus is the tube that carries food from the mouth to the stomach.  Sudden changes in air pressure, such as from descending in an airplane or scuba diving.  Abnormal growths in the nose or throat, such as: ? Growths that line the nose (nasal polyps). ? Abnormal growth of cells (tumors). ? Enlarged tissue at the back of the throat  (adenoids). What increases the risk? You are more likely to develop this condition if:  You smoke.  You are overweight.  You are a child who has: ? Certain birth defects of the mouth, such as cleft palate. ? Large tonsils or adenoids. What are the signs or symptoms? Common symptoms of this condition include:  A feeling of fullness in the ear.  Ear pain.  Clicking or popping noises in the ear.  Ringing in the ear.  Hearing loss.  Loss of balance.  Dizziness. Symptoms may get worse when the air pressure around you changes, such as when you travel to an area of high elevation, fly on an airplane, or go scuba diving. How is this diagnosed? This condition may be diagnosed based on:  Your symptoms.  A physical exam of your ears, nose, and throat.  Tests, such as those that measure: ? The movement of your eardrum (tympanogram). ? Your hearing (audiometry). How is this treated? Treatment depends on the cause and severity of your condition.  In mild cases, you may relieve your symptoms by moving air into your ears. This is called "popping the ears."  In more severe cases, or if you have symptoms of fluid in your ears, treatment may include: ? Medicines to relieve congestion (decongestants). ? Medicines that treat allergies (antihistamines). ? Nasal sprays or ear drops that contain medicines that reduce swelling (steroids). ? A procedure to drain the fluid in your eardrum (myringotomy). In this procedure, a small tube is placed in the eardrum to:  Drain the fluid.  Restore the air in the middle ear space. ? A procedure to insert a balloon device through the nose to inflate the opening of the eustachian tube (balloon dilation). Follow these instructions at home: Lifestyle  Do not do any of the following until your health care provider approves: ? Travel to high altitudes. ? Fly in airplanes. ? Work in a Estate agent or room. ? Scuba dive.  Do not use any  products that contain nicotine or tobacco, such as cigarettes and e-cigarettes. If you need help quitting, ask your health care provider.  Keep your ears dry. Wear fitted earplugs during showering and bathing. Dry your ears completely after. General instructions  Take over-the-counter and prescription medicines only as told by your health care provider.  Use techniques to help pop your ears as recommended by your health care provider. These may include: ? Chewing gum. ? Yawning. ? Frequent, forceful swallowing. ? Closing your mouth, holding your nose closed, and gently blowing as if you are trying to blow air out of your nose.  Keep all follow-up visits as told by your health care provider. This is important. Contact a health care provider if:  Your symptoms do not go away after treatment.  Your symptoms come back after treatment.  You are unable to pop your ears.  You have: ? A fever. ? Pain in your ear. ? Pain in your head or neck. ? Fluid draining from your ear.  Your hearing suddenly changes.  You become very dizzy.  You lose your balance. Summary  Eustachian tube dysfunction refers  to a condition in which a blockage develops in the eustachian tube.  It can be caused by ear infections, allergies, inhaled irritants, or abnormal growths in the nose or throat.  Symptoms include ear pain, hearing loss, or ringing in the ears.  Mild cases are treated with maneuvers to unblock the ears, such as yawning or ear popping.  Severe cases are treated with medicines. Surgery may also be done (rare). This information is not intended to replace advice given to you by your health care provider. Make sure you discuss any questions you have with your health care provider. Document Released: 08/04/2015 Document Revised: 10/28/2017 Document Reviewed: 10/28/2017 Elsevier Interactive Patient Education  2019 [employer]. How to Perform a Sinus Rinse A sinus rinse is a home treatment  that is used to rinse your sinuses with a sterile mixture of salt and water (saline solution). Sinuses are air-filled spaces in your skull behind the bones of your face and forehead that open into your nasal cavity. A sinus rinse can help to clear mucus, dirt, dust, or pollen from your nasal cavity. You may do a sinus rinse when you have a cold, a virus, nasal allergy symptoms, a sinus infection, or stuffiness in your nose or sinuses. Talk with your health care provider about whether a sinus rinse might help you. What are the risks? A sinus rinse is generally safe and effective. However, there are a few risks, which include:  A burning sensation in your sinuses. This may happen if you do not make the saline solution as directed. Be sure to follow all directions when making the saline solution.  Nasal irritation.  Infection from contaminated water. This is rare, but possible. Do not do a sinus rinse if you have had ear or nasal surgery, ear infection, or blocked ears. Supplies needed:  Saline solution or powder.  Distilled or sterile water may be needed to mix with saline powder. ? You may use boiled and cooled tap water. Boil tap water for 5 minutes; cool until it is lukewarm. Use within 24 hours. ? Do not use regular tap water to mix with the saline solution.  Neti pot or nasal rinse bottle. These supplies release the saline solution into your nose and through your sinuses. Neti pots and nasal rinse bottles can be purchased at [employer], a health food store, or online. How to perform a sinus rinse  1. Wash your hands with soap and water. 2. Wash your device according to the directions that came with the product and then dry it. 3. Use the solution that comes with your product or one that is sold separately in stores. Follow the mixing directions on the package if you need to mix with sterile or distilled water. 4. Fill the device with the amount of saline solution noted in the  device instructions. 5. Stand over a sink and tilt your head sideways over the sink. 6. Place the spout of the device in your upper nostril (the one closer to the ceiling). 7. Gently pour or squeeze the saline solution into your nasal cavity. The liquid should drain out from the lower nostril if you are not too congested. 8. While rinsing, breathe through your open mouth. 9. Gently blow your nose to clear any mucus and rinse solution. Blowing too hard may cause ear pain. 10. Repeat in your other nostril. 11. Clean and rinse your device with clean water and then air-dry it. Talk with your health care provider or pharmacist if  you have questions about how to do a sinus rinse. Summary  A sinus rinse is a home treatment that is used to rinse your sinuses with a sterile mixture of salt and water (saline solution).  A sinus rinse is generally safe and effective. Follow all instructions carefully.  Before doing a sinus rinse, talk with your health care provider about whether it would be helpful for you. This information is not intended to replace advice given to you by your health care provider. Make sure you discuss any questions you have with your health care provider. Document Released: 02/02/2014 Document Revised: 05/05/2017 Document Reviewed: 05/05/2017 Elsevier Interactive Patient Education  2019 Else[employer] Inc. Viral Respiratory Infection A respiratory infection is an illness that affects part of the respiratory system, such as the lungs, nose, or throat. A respiratory infection that is caused by a virus is called a viral respiratory infection. Common types of viral respiratory infections include:  A cold.  The flu (influenza).  A respiratory syncytial virus (RSV) infection. What are the causes? This condition is caused by a virus. What are the signs or symptoms? Symptoms of this condition include:  A stuffy or runny nose.  Yellow or green nasal discharge.  A  cough.  Sneezing.  Fatigue.  Achy muscles.  A sore throat.  Sweating or chills.  A fever.  A headache. How is this diagnosed? This condition may be diagnosed based on:  Your symptoms.  A physical exam.  Testing of nasal swabs. How is this treated? This condition may be treated with medicines, such as:  Antiviral medicine. This may shorten the length of time a person has symptoms.  Expectorants. These make it easier to cough up mucus.  Decongestant nasal sprays.  Acetaminophen or NSAIDs to relieve fever and pain. Antibiotic medicines are not prescribed for viral infections. This is because antibiotics are designed to kill bacteria. They are not effective against viruses. Follow these instructions at home:  Managing pain and congestion  Take over-the-counter and prescription medicines only as told by your health care provider.  If you have a sore throat, gargle with a salt-water mixture 3-4 times a day or as needed. To make a salt-water mixture, completely dissolve -1 tsp of salt in 1 cup of warm water.  Use nose drops made from salt water to ease congestion and soften raw skin around your nose.  Drink enough fluid to keep your urine pale yellow. This helps prevent dehydration and helps loosen up mucus. General instructions  Rest as much as possible.  Do not drink alcohol.  Do not use any products that contain nicotine or tobacco, such as cigarettes and e-cigarettes. If you need help quitting, ask your health care provider.  Keep all follow-up visits as told by your health care provider. This is important. How is this prevented?   Get an annual flu shot. You may get the flu shot in late summer, fall, or winter. Ask your health care provider when you should get your flu shot.  Avoid exposing others to your respiratory infection. ? Stay home from work or school as told by your health care provider. ? Wash your hands with soap and water often, especially after  you cough or sneeze. If soap and water are not available, use alcohol-based hand sanitizer.  Avoid contact with people who are sick during cold and flu season. This is generally fall and winter. Contact a health care provider if:  Your symptoms last for 10 days or longer.  Your  symptoms get worse over time.  You have a fever.  You have severe sinus pain in your face or forehead.  The glands in your jaw or neck become very swollen. Get help right away if you:  Feel pain or pressure in your chest.  Have shortness of breath.  Faint or feel like you will faint.  Have severe and persistent vomiting.  Feel confused or disoriented. Summary  A respiratory infection is an illness that affects part of the respiratory system, such as the lungs, nose, or throat. A respiratory infection that is caused by a virus is called a viral respiratory infection.  Common types of viral respiratory infections are a cold, influenza, and respiratory syncytial virus (RSV) infection.  Symptoms of this condition include a stuffy or runny nose, cough, sneezing, fatigue, achy muscles, sore throat, and fevers or chills.  Antibiotic medicines are not prescribed for viral infections. This is because antibiotics are designed to kill bacteria. They are not effective against viruses. This information is not intended to replace advice given to you by your health care provider. Make sure you discuss any questions you have with your health care provider. Document Released: 04/17/2005 Document Revised: 08/18/2017 Document Reviewed: 08/18/2017 Elsevier Interactive Patient Education  2019 [employer]

## 2018-08-30 DIAGNOSIS — J111 Influenza due to unidentified influenza virus with other respiratory manifestations: Secondary | ICD-10-CM | POA: Diagnosis not present

## 2018-08-30 DIAGNOSIS — R6883 Chills (without fever): Secondary | ICD-10-CM | POA: Diagnosis not present

## 2018-08-30 DIAGNOSIS — R6889 Other general symptoms and signs: Secondary | ICD-10-CM | POA: Diagnosis not present

## 2018-09-03 ENCOUNTER — Ambulatory Visit: Payer: Self-pay | Admitting: Nurse Practitioner

## 2018-09-03 ENCOUNTER — Encounter: Payer: Self-pay | Admitting: Primary Care

## 2018-09-03 ENCOUNTER — Ambulatory Visit: Payer: BLUE CROSS/BLUE SHIELD | Admitting: Primary Care

## 2018-09-03 VITALS — BP 126/86 | HR 64 | Temp 98.7°F | Ht 70.0 in | Wt 243.5 lb

## 2018-09-03 DIAGNOSIS — J029 Acute pharyngitis, unspecified: Secondary | ICD-10-CM

## 2018-09-03 DIAGNOSIS — J069 Acute upper respiratory infection, unspecified: Secondary | ICD-10-CM

## 2018-09-03 DIAGNOSIS — B9789 Other viral agents as the cause of diseases classified elsewhere: Secondary | ICD-10-CM

## 2018-09-03 LAB — POCT RAPID STREP A (OFFICE): RAPID STREP A SCREEN: NEGATIVE

## 2018-09-03 NOTE — Patient Instructions (Signed)
Your symptoms are representative of a viral illness which will resolve on its own over time. Our goal is to treat your symptoms in order to aid your body in the healing process and to make you more comfortable.   Continue Tamiflu as prescribed. Continue Mucinex as needed for congestion. You can take Tylenol or Ibuprofen as needed for fevers, body aches.  You may return to work tomorrow if you remain afebrile.  It was a pleasure to see you today!

## 2018-09-03 NOTE — Addendum Note (Signed)
Addended by: Tawnya Crook on: 09/03/2018 11:54 AM   Modules accepted: Orders

## 2018-09-03 NOTE — Progress Notes (Signed)
Subjective:    Patient ID: Anthony Ochoa, male    DOB: 1986-03-19, 33 y.o.   MRN: [id number]  HPI  Mr. Anthony Ochoa is a 33 year old male who presents today with a chief complaint of cough.  He also reports nasal congestion, otalgia, sore throat, fevers, chills. His symptoms began 6 days ago with a sore throat. The following day he noticed fevers. He presented to [facility] four days ago, tested negative for influenza. He was treated with a course of [place] given symptoms. He has been compliant to his Tamiflu and has one dose left.  He continues to notice a sore throat which is worse, also increased cough, continued fatigue, weakness. He has missed several days of work this week. His cough has been productive with yellow/green sputum. His last fever was two days ago which was 99.8. He has also been taking Mucinex and cough syrup.   Review of Systems  Constitutional: Positive for fatigue. Negative for fever.  HENT: Positive for congestion, postnasal drip and sore throat.   Respiratory: Positive for cough. Negative for shortness of breath.   Neurological: Negative for headaches.       Past Medical History:  Diagnosis Date  . Allergy   . Chickenpox   . Croup   . GERD (gastroesophageal reflux disease)   . Migraines      Social History   Socioeconomic History  . Marital status: Married    Spouse name: Not on file  . Number of children: Not on file  . Years of education: Not on file  . Highest education level: Not on file  Occupational History  . Not on file  Social Needs  . Financial resource strain: Not on file  . Food insecurity:    Worry: Not on file    Inability: Not on file  . Transportation needs:    Medical: Not on file    Non-medical: Not on file  Tobacco Use  . Smoking status: Never Smoker  . Smokeless tobacco: Never Used  Substance and Sexual Activity  . Alcohol use: Yes  . Drug use: Never  . Sexual activity: Not on file  Lifestyle  . Physical  activity:    Days per week: Not on file    Minutes per session: Not on file  . Stress: Not on file  Relationships  . Social connections:    Talks on phone: Not on file    Gets together: Not on file    Attends religious service: Not on file    Active member of club or organization: Not on file    Attends meetings of clubs or organizations: Not on file    Relationship status: Not on file  . Intimate partner violence:    Fear of current or ex partner: Not on file    Emotionally abused: Not on file    Physically abused: Not on file    Forced sexual activity: Not on file  Other Topics Concern  . Not on file  Social History Narrative   Married.   2 children.   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     Past Surgical History:  Procedure Laterality Date  . TONSILLECTOMY  2002    Family History  Problem Relation Age of Onset  . Miscarriages / [place] Mother   . Alcohol abuse Father   . Hyperlipidemia Maternal Grandmother   . Hypertension Maternal Grandmother   . Heart disease Maternal Grandfather   . Hyperlipidemia Maternal Grandfather   .  Hypertension Maternal Grandfather   . Stroke Maternal Grandfather     Allergies  Allergen Reactions  . Penicillins Rash    Current Outpatient Medications on File Prior to Visit  Medication Sig Dispense Refill  . albuterol (PROVENTIL HFA;VENTOLIN HFA) 108 (90 Base) MCG/ACT inhaler Inhale into the lungs every 6 (six) hours as needed for wheezing or shortness of breath.    Marland Kitchen. azelastine (OPTIVAR) 0.05 % ophthalmic solution   3  . fluticasone (FLONASE) 50 MCG/ACT nasal spray Place into both nostrils daily.    Marland Kitchen. guaiFENesin (MUCINEX) 600 MG 12 hr tablet Take by mouth 2 (two) times daily.    Marland Kitchen. levocetirizine (XYZAL) 5 MG tablet TK 1 T PO QD IN THE EVE  3  . oseltamivir (TAMIFLU) 75 MG capsule TK 1 C PO BID FOR 5 DAYS    . rizatriptan (MAXALT) 10 MG tablet Take 1 tablet (10 mg total) by mouth as needed for migraine. May repeat in 2 hours if  needed 10 tablet 0   No current facility-administered medications on file prior to visit.     BP 126/86   Pulse 64   Temp 98.7 F (37.1 C) (Oral)   Ht 5\' 10"  (1.778 m)   Wt 243 lb 8 oz (110.5 kg)   SpO2 98%   BMI 34.94 kg/m    Objective:   Physical Exam  Constitutional: He appears well-nourished. He appears ill.  HENT:  Right Ear: Tympanic membrane and ear canal normal.  Left Ear: Tympanic membrane and ear canal normal.  Nose: Mucosal edema present. Right sinus exhibits no maxillary sinus tenderness and no frontal sinus tenderness. Left sinus exhibits no maxillary sinus tenderness and no frontal sinus tenderness.  Mouth/Throat: Posterior oropharyngeal erythema present.  Neck: Neck supple.  Cardiovascular: Normal rate and regular rhythm.  Respiratory: Effort normal and breath sounds normal. He has no wheezes.  Skin: Skin is warm and dry.           Assessment & Plan:  Viral URI:  Fevers, chills, body aches, cough, congestion x 6 days. Compliant to Tamiflu per Urgent Care. Rapid strep today negative Exam today with clear lungs, does appear fatigued. Do suspect viral etiology whether it may be flu or other.  Continue Tamiflu, Mucinex PRN, tylenol/ibuprofen PRN.  Work note provided.  Doreene NestKatherine K Kishaun Erekson, NP

## 2018-12-10 DIAGNOSIS — Z03818 Encounter for observation for suspected exposure to other biological agents ruled out: Secondary | ICD-10-CM | POA: Diagnosis not present

## 2019-04-22 ENCOUNTER — Ambulatory Visit (INDEPENDENT_AMBULATORY_CARE_PROVIDER_SITE_OTHER): Payer: BC Managed Care – PPO | Admitting: Primary Care

## 2019-04-22 ENCOUNTER — Encounter: Payer: Self-pay | Admitting: Primary Care

## 2019-04-22 ENCOUNTER — Other Ambulatory Visit: Payer: Self-pay

## 2019-04-22 DIAGNOSIS — R002 Palpitations: Secondary | ICD-10-CM | POA: Diagnosis not present

## 2019-04-22 HISTORY — DX: Palpitations: R00.2

## 2019-04-22 NOTE — Assessment & Plan Note (Signed)
Pulsating sensation to right occipital lobe. Exam today as well as HPI appear benign. Blood pressure is under great control. It does not appear that he is suffering from a migraine.  No family history of aneurysm.  Given the lack of any other symptoms coupled with benign exam we will have him monitor symptoms and report if symptoms persist/progress.  At that point consider neurology evaluation versus imaging of his brain.  He will update.

## 2019-04-22 NOTE — Patient Instructions (Signed)
Please update me if your symptoms progress, you notice the sensation radiate elsewhere, you develop headaches/dizziness/visual changes.  It was a pleasure to see you today!

## 2019-04-22 NOTE — Progress Notes (Signed)
Subjective:    Patient ID: Anthony Ochoa, male    DOB: 1986/05/30, 33 y.o.   MRN: [id number]  HPI  Anthony Ochoa is a 33 year old male with a history of migraines, seasonal allergies who presents today with a chief complaint of pulsating sensation.  BP Readings from Last 3 Encounters:  04/22/19 126/74  09/03/18 126/86  07/29/18 129/70   Two weeks ago he started noticing a pulsating sensation to the right occipital lobe with radiation to the right posterior ear which was intermittent and lasting a few seconds at a time. Several days later he noticed an increase intensity of the pulsating sensation, without pain. He had been staring at computer screens all day that day so he thought this may have contributed.  His symptoms lasted for about one hour then dissipated.  One week ago he woke up with this same pulsating sensation that lasted for a few hours.   Symptoms have been intermittent for the last two weeks, no pain. Feels as though pressure will intermittently release. He does notice some sensitivity to light during one episode.   He denies dizziness, fevers, cough, ear pain, sore throat, headache, nausea, photophobia, anxiety/stress. He had an eye exam yesterday with his optometrist which was overall unchanged.  This does not feel like a typical migraine or headache.  He denies family history of aneurysm.  Review of Systems  Constitutional: Negative for fatigue.  HENT: Negative for congestion.   Eyes: Negative for visual disturbance.  Respiratory: Negative for cough.   Cardiovascular: Negative for chest pain.  Skin: Negative for color change and rash.  Allergic/Immunologic: Positive for environmental allergies.  Neurological: Negative for dizziness and headaches.  Psychiatric/Behavioral: The patient is not nervous/anxious.        Past Medical History:  Diagnosis Date  . Allergy   . Chickenpox   . Croup   . GERD (gastroesophageal reflux disease)   . Migraines      Social  History   Socioeconomic History  . Marital status: Married    Spouse name: Not on file  . Number of children: Not on file  . Years of education: Not on file  . Highest education level: Not on file  Occupational History  . Not on file  Social Needs  . Financial resource strain: Not on file  . Food insecurity    Worry: Not on file    Inability: Not on file  . Transportation needs    Medical: Not on file    Non-medical: Not on file  Tobacco Use  . Smoking status: Never Smoker  . Smokeless tobacco: Never Used  Substance and Sexual Activity  . Alcohol use: Yes  . Drug use: Never  . Sexual activity: Not on file  Lifestyle  . Physical activity    Days per week: Not on file    Minutes per session: Not on file  . Stress: Not on file  Relationships  . Social Musician on phone: Not on file    Gets together: Not on file    Attends religious service: Not on file    Active member of club or organization: Not on file    Attends meetings of clubs or organizations: Not on file    Relationship status: Not on file  . Intimate partner violence    Fear of current or ex partner: Not on file    Emotionally abused: Not on file    Physically abused: Not on file  Forced sexual activity: Not on file  Other Topics Concern  . Not on file  Social History Narrative   Married.   2 children.   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     Past Surgical History:  Procedure Laterality Date  . TONSILLECTOMY  2002    Family History  Problem Relation Age of Onset  . Miscarriages / [place] Mother   . Alcohol abuse Father   . Hyperlipidemia Maternal Grandmother   . Hypertension Maternal Grandmother   . Heart disease Maternal Grandfather   . Hyperlipidemia Maternal Grandfather   . Hypertension Maternal Grandfather   . Stroke Maternal Grandfather     Allergies  Allergen Reactions  . Penicillins Rash    Current Outpatient Medications on File Prior to Visit  Medication Sig  Dispense Refill  . albuterol (PROVENTIL HFA;VENTOLIN HFA) 108 (90 Base) MCG/ACT inhaler Inhale into the lungs every 6 (six) hours as needed for wheezing or shortness of breath.    Marland Kitchen azelastine (OPTIVAR) 0.05 % ophthalmic solution   3  . fluticasone (FLONASE) 50 MCG/ACT nasal spray Place into both nostrils daily.    Marland Kitchen guaiFENesin (MUCINEX) 600 MG 12 hr tablet Take by mouth 2 (two) times daily.    Marland Kitchen levocetirizine (XYZAL) 5 MG tablet TK 1 T PO QD IN THE EVE  3  . rizatriptan (MAXALT) 10 MG tablet Take 1 tablet (10 mg total) by mouth as needed for migraine. May repeat in 2 hours if needed 10 tablet 0   No current facility-administered medications on file prior to visit.     BP 126/74   Pulse 70   Temp 97.9 F (36.6 C) (Temporal)   Ht 5\' 10"  (1.778 m)   Wt 243 lb 4 oz (110.3 kg)   SpO2 97%   BMI 34.90 kg/m    Objective:   Physical Exam  Constitutional: He appears well-nourished.  HENT:  Right Ear: Tympanic membrane and ear canal normal.  Left Ear: Tympanic membrane and ear canal normal.  Neck: Normal range of motion. Neck supple. Carotid bruit is not present.  Cardiovascular: Normal rate and regular rhythm.  Respiratory: Effort normal and breath sounds normal.  Lymphadenopathy:    He has no cervical adenopathy.  Skin: Skin is warm and dry.  Psychiatric: He has a normal mood and affect.           Assessment & Plan:

## 2019-06-29 ENCOUNTER — Ambulatory Visit (INDEPENDENT_AMBULATORY_CARE_PROVIDER_SITE_OTHER): Payer: BC Managed Care – PPO | Admitting: Family Medicine

## 2019-06-29 ENCOUNTER — Ambulatory Visit (INDEPENDENT_AMBULATORY_CARE_PROVIDER_SITE_OTHER)
Admission: RE | Admit: 2019-06-29 | Discharge: 2019-06-29 | Disposition: A | Discharge status: Home / Self Care | Payer: BC Managed Care – PPO | Source: Ambulatory Visit | Attending: Family Medicine | Admitting: Family Medicine

## 2019-06-29 ENCOUNTER — Other Ambulatory Visit: Payer: Self-pay

## 2019-06-29 ENCOUNTER — Encounter: Payer: Self-pay | Admitting: Family Medicine

## 2019-06-29 VITALS — BP 130/60 | HR 77 | Temp 97.6°F | Ht 70.0 in | Wt 251.1 lb

## 2019-06-29 DIAGNOSIS — M79646 Pain in unspecified finger(s): Secondary | ICD-10-CM

## 2019-06-29 DIAGNOSIS — M79641 Pain in right hand: Secondary | ICD-10-CM | POA: Diagnosis not present

## 2019-06-29 NOTE — Progress Notes (Signed)
This visit occurred during the SARS-CoV-2 public health emergency.  Safety protocols were in place, including screening questions prior to the visit, additional usage of staff PPE, and extensive cleaning of exam room while observing appropriate contact time as indicated for disinfecting solutions.   Woke up this AM with pain in R5th finger. Pain in 5th finger with ROM,  radiating up the hand.  Dorsal and palmar pain on the finger and hand.  Took advil w/o relief.  No trauma.  No locking.  No triggering.    Meds, vitals, and allergies reviewed.   ROS: Per HPI unless specifically indicated in ROS section   nad R hand with normal inspection.  No tendon deficit on range of motion of the fingers x5 Normal cap refill and sensation.  Mildly ttp on distal R 5th MC.  Pain on resisted 5th flex and ext.  No erythema or bruising. Distally neurovascular intact.

## 2019-06-29 NOTE — Patient Instructions (Signed)
Presumed tendon strain.  I don't see a fracture.  Use the splint for now and gradually wean out of that as pain allows.  Update me as needed.  Take care.  Glad to see you.

## 2019-07-01 ENCOUNTER — Ambulatory Visit (INDEPENDENT_AMBULATORY_CARE_PROVIDER_SITE_OTHER): Payer: BC Managed Care – PPO | Admitting: Primary Care

## 2019-07-01 ENCOUNTER — Other Ambulatory Visit: Payer: Self-pay

## 2019-07-01 ENCOUNTER — Encounter: Payer: Self-pay | Admitting: Primary Care

## 2019-07-01 VITALS — BP 124/84 | HR 72 | Temp 96.6°F | Ht 70.0 in | Wt 250.8 lb

## 2019-07-01 DIAGNOSIS — J3089 Other allergic rhinitis: Secondary | ICD-10-CM | POA: Diagnosis not present

## 2019-07-01 DIAGNOSIS — M79646 Pain in unspecified finger(s): Secondary | ICD-10-CM | POA: Insufficient documentation

## 2019-07-01 DIAGNOSIS — K219 Gastro-esophageal reflux disease without esophagitis: Secondary | ICD-10-CM

## 2019-07-01 DIAGNOSIS — G43709 Chronic migraine without aura, not intractable, without status migrainosus: Secondary | ICD-10-CM | POA: Diagnosis not present

## 2019-07-01 DIAGNOSIS — Z0000 Encounter for general adult medical examination without abnormal findings: Secondary | ICD-10-CM | POA: Diagnosis not present

## 2019-07-01 DIAGNOSIS — R002 Palpitations: Secondary | ICD-10-CM

## 2019-07-01 LAB — CBC
HCT: 45.4 % (ref 39.0–52.0)
Hemoglobin: 15.4 g/dL (ref 13.0–17.0)
MCHC: 33.9 g/dL (ref 30.0–36.0)
MCV: 87.7 fl (ref 78.0–100.0)
Platelets: 261 10*3/uL (ref 150.0–400.0)
RBC: 5.17 Mil/uL (ref 4.22–5.81)
RDW: 14 % (ref 11.5–15.5)
WBC: 6.1 10*3/uL (ref 4.0–10.5)

## 2019-07-01 LAB — COMPREHENSIVE METABOLIC PANEL
ALT: 29 U/L (ref 0–53)
AST: 20 U/L (ref 0–37)
Albumin: 4.1 g/dL (ref 3.5–5.2)
Alkaline Phosphatase: 71 U/L (ref 39–117)
BUN: 12 mg/dL (ref 6–23)
CO2: 26 mEq/L (ref 19–32)
Calcium: 9 mg/dL (ref 8.4–10.5)
Chloride: 105 mEq/L (ref 96–112)
Creatinine, Ser: 1.01 mg/dL (ref 0.40–1.50)
GFR: 84.19 mL/min (ref >60.00)
Glucose, Bld: 98 mg/dL (ref 70–99)
Potassium: 4.2 mEq/L (ref 3.5–5.1)
Sodium: 138 mEq/L (ref 135–145)
Total Bilirubin: 0.7 mg/dL (ref 0.2–1.2)
Total Protein: 7.3 g/dL (ref 6.0–8.3)

## 2019-07-01 LAB — LIPID PANEL
Cholesterol: 190 mg/dL (ref 0–200)
HDL: 41.3 mg/dL (ref >39.00)
LDL Cholesterol: 116 mg/dL — ABNORMAL HIGH (ref 0–99)
NonHDL: 149
Total CHOL/HDL Ratio: 5
Triglycerides: 165 mg/dL — ABNORMAL HIGH (ref 0.0–149.0)
VLDL: 33 mg/dL (ref 0.0–40.0)

## 2019-07-01 NOTE — Assessment & Plan Note (Addendum)
No recent sensation, dizziness, headaches. Continue to monitor.

## 2019-07-01 NOTE — Progress Notes (Signed)
Subjective:    Patient ID: Anthony Ochoa, male    DOB: 09-22-1985, 33 y.o.   MRN: [id number]  HPI  Anthony Ochoa is a 33 year old male who presents today for complete physical.  Immunizations: -Tetanus: Completed in 2018 -Influenza: Completed this season   Diet: He endorses a better diet, more home cooked meals. Exercise: Some walking, some push ups/stretching.  Eye exam: Completed in 2020 Dental exam: Completes semi-annually   BP Readings from Last 3 Encounters:  07/01/19 124/84  06/29/19 130/60  04/22/19 126/74     Review of Systems  Constitutional: Negative for unexpected weight change.  HENT: Negative for rhinorrhea.   Respiratory: Negative for cough and shortness of breath.   Cardiovascular: Negative for chest pain.  Gastrointestinal: Negative for constipation and diarrhea.  Genitourinary: Negative for difficulty urinating.  Musculoskeletal: Positive for arthralgias.       Acute right finger pain, saw Anthony Ochoa  Skin: Negative for rash.  Allergic/Immunologic: Negative for environmental allergies.  Neurological: Negative for dizziness, numbness and headaches.  Psychiatric/Behavioral: The patient is not nervous/anxious.        Past Medical History:  Diagnosis Date  . Allergy   . Chickenpox   . Croup   . GERD (gastroesophageal reflux disease)   . Migraines      Social History   Socioeconomic History  . Marital status: Married    Spouse name: Not on file  . Number of children: Not on file  . Years of education: Not on file  . Highest education level: Not on file  Occupational History  . Not on file  Tobacco Use  . Smoking status: Never Smoker  . Smokeless tobacco: Never Used  Substance and Sexual Activity  . Alcohol use: Yes  . Drug use: Never  . Sexual activity: Not on file  Other Topics Concern  . Not on file  Social History Narrative   Married.   2 children.   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    Social Determinants of Health    Financial Resource Strain:   . Difficulty of Paying Living Expenses: Not on file  Food Insecurity:   . Worried About Charity fundraiser in the Last Year: Not on file  . Ran Out of Food in the Last Year: Not on file  Transportation Needs:   . Lack of Transportation (Medical): Not on file  . Lack of Transportation (Non-Medical): Not on file  Physical Activity:   . Days of Exercise per Week: Not on file  . Minutes of Exercise per Session: Not on file  Stress:   . Feeling of Stress : Not on file  Social Connections:   . Frequency of Communication with Friends and Family: Not on file  . Frequency of Social Gatherings with Friends and Family: Not on file  . Attends Religious Services: Not on file  . Active Member of Clubs or Organizations: Not on file  . Attends Archivist Meetings: Not on file  . Marital Status: Not on file  Intimate Partner Violence:   . Fear of Current or Ex-Partner: Not on file  . Emotionally Abused: Not on file  . Physically Abused: Not on file  . Sexually Abused: Not on file    Past Surgical History:  Procedure Laterality Date  . TONSILLECTOMY  2002    Family History  Problem Relation Age of Onset  . Miscarriages / [place] Mother   . Alcohol abuse Father   . Hyperlipidemia Maternal  Grandmother   . Hypertension Maternal Grandmother   . Heart disease Maternal Grandfather   . Hyperlipidemia Maternal Grandfather   . Hypertension Maternal Grandfather   . Stroke Maternal Grandfather     Allergies  Allergen Reactions  . Penicillins Rash    Current Outpatient Medications on File Prior to Visit  Medication Sig Dispense Refill  . albuterol (PROVENTIL HFA;VENTOLIN HFA) 108 (90 Base) MCG/ACT inhaler Inhale into the lungs every 6 (six) hours as needed for wheezing or shortness of breath.    Marland Kitchen azelastine (OPTIVAR) 0.05 % ophthalmic solution   3  . fluticasone (FLONASE) 50 MCG/ACT nasal spray Place into both nostrils daily.    Marland Kitchen guaiFENesin  (MUCINEX) 600 MG 12 hr tablet Take by mouth 2 (two) times daily.    Marland Kitchen loratadine (CLARITIN) 10 MG tablet Take 10 mg by mouth daily.    . rizatriptan (MAXALT) 10 MG tablet Take 1 tablet (10 mg total) by mouth as needed for migraine. May repeat in 2 hours if needed 10 tablet 0   No current facility-administered medications on file prior to visit.    BP 124/84   Pulse 72   Temp (!) 96.6 F (35.9 C) (Temporal)   Ht 5\' 10"  (1.778 m)   Wt 250 lb 12 oz (113.7 kg)   SpO2 97%   BMI 35.98 kg/m    Objective:   Physical Exam  Constitutional: He is oriented to person, place, and time. He appears well-nourished.  HENT:  Right Ear: Tympanic membrane and ear canal normal.  Left Ear: Tympanic membrane and ear canal normal.  Mouth/Throat: Oropharynx is clear and moist.  Eyes: Pupils are equal, round, and reactive to light. EOM are normal.  Cardiovascular: Normal rate and regular rhythm.  Respiratory: Effort normal and breath sounds normal.  GI: Soft. Bowel sounds are normal. There is no abdominal tenderness.  Musculoskeletal:        General: Normal range of motion.     Cervical back: Neck supple.  Neurological: He is alert and oriented to person, place, and time. No cranial nerve deficit.  Reflex Scores:      Patellar reflexes are 2+ on the right side and 2+ on the left side. Skin: Skin is warm and dry.  Psychiatric: He has a normal mood and affect.           Assessment & Plan:

## 2019-07-01 NOTE — Patient Instructions (Signed)
Stop by the lab prior to leaving today. I will notify you of your results once received.   Start exercising. You should be getting 150 minutes of moderate intensity exercise weekly.  Continue to work on a healthy diet.  Ensure you are consuming 64 ounces of water daily.  It was a pleasure to see you today!    Preventive Care 21-34 Years Old, Male Preventive care refers to lifestyle choices and visits with your health care provider that can promote health and wellness. This includes:  A yearly physical exam. This is also called an annual well check.  Regular dental and eye exams.  Immunizations.  Screening for certain conditions.  Healthy lifestyle choices, such as eating a healthy diet, getting regular exercise, not using drugs or products that contain nicotine and tobacco, and limiting alcohol use. What can I expect for my preventive care visit? Physical exam Your health care provider will check:  Height and weight. These may be used to calculate body mass index (BMI), which is a measurement that tells if you are at a healthy weight.  Heart rate and blood pressure.  Your skin for abnormal spots. Counseling Your health care provider may ask you questions about:  Alcohol, tobacco, and drug use.  Emotional well-being.  Home and relationship well-being.  Sexual activity.  Eating habits.  Work and work environment. What immunizations do I need?  Influenza (flu) vaccine  This is recommended every year. Tetanus, diphtheria, and pertussis (Tdap) vaccine  You may need a Td booster every 10 years. Varicella (chickenpox) vaccine  You may need this vaccine if you have not already been vaccinated. Human papillomavirus (HPV) vaccine  If recommended by your health care provider, you may need three doses over 6 months. Measles, mumps, and rubella (MMR) vaccine  You may need at least one dose of MMR. You may also need a second dose. Meningococcal conjugate (MenACWY)  vaccine  One dose is recommended if you are 19-21 years old and a first-year college student living in a residence hall, or if you have one of several medical conditions. You may also need additional booster doses. Pneumococcal conjugate (PCV13) vaccine  You may need this if you have certain conditions and were not previously vaccinated. Pneumococcal polysaccharide (PPSV23) vaccine  You may need one or two doses if you smoke cigarettes or if you have certain conditions. Hepatitis A vaccine  You may need this if you have certain conditions or if you travel or work in places where you may be exposed to hepatitis A. Hepatitis B vaccine  You may need this if you have certain conditions or if you travel or work in places where you may be exposed to hepatitis B. Haemophilus influenzae type b (Hib) vaccine  You may need this if you have certain risk factors. You may receive vaccines as individual doses or as more than one vaccine together in one shot (combination vaccines). Talk with your health care provider about the risks and benefits of combination vaccines. What tests do I need? Blood tests  Lipid and cholesterol levels. These may be checked every 5 years starting at age 20.  Hepatitis C test.  Hepatitis B test. Screening   Diabetes screening. This is done by checking your blood sugar (glucose) after you have not eaten for a while (fasting).  Sexually transmitted disease (STD) testing. Talk with your health care provider about your test results, treatment options, and if necessary, the need for more tests. Follow these instructions at home: Eating   and drinking   Eat a diet that includes fresh fruits and vegetables, whole grains, lean protein, and low-fat dairy products.  Take vitamin and mineral supplements as recommended by your health care provider.  Do not drink alcohol if your health care provider tells you not to drink.  If you drink alcohol: ? Limit how much you have  to 0-2 drinks a day. ? Be aware of how much alcohol is in your drink. In the [place] one drink equals one 12 oz bottle of beer (355 mL), one 5 oz glass of wine (148 mL), or one 1 oz glass of hard liquor (44 mL). Lifestyle  Take daily care of your teeth and gums.  Stay active. Exercise for at least 30 minutes on 5 or more days each week.  Do not use any products that contain nicotine or tobacco, such as cigarettes, e-cigarettes, and chewing tobacco. If you need help quitting, ask your health care provider.  If you are sexually active, practice safe sex. Use a condom or other form of protection to prevent STIs (sexually transmitted infections). What's next?  Go to your health care provider once a year for a well check visit.  Ask your health care provider how often you should have your eyes and teeth checked.  Stay up to date on all vaccines. This information is not intended to replace advice given to you by your health care provider. Make sure you discuss any questions you have with your health care provider. Document Released: 09/03/2001 Document Revised: 07/02/2018 Document Reviewed: 07/02/2018 Elsevier Patient Education  2020 Elsevier Inc.  

## 2019-07-01 NOTE — Assessment & Plan Note (Signed)
Overall infrequent, he is working to avoid triggers. Continue to monitor.

## 2019-07-01 NOTE — Assessment & Plan Note (Signed)
No recent migraine, infrequent use of Maxalt. Continue to monitor.

## 2019-07-01 NOTE — Assessment & Plan Note (Signed)
Overall doing well, no recent use of albuterol inhaler. Using eye drops PRN.

## 2019-07-01 NOTE — Assessment & Plan Note (Signed)
He felt better in the splint.  Likely soft tissue/tendon irritation.  X-ray negative.  Splint as needed.  Gradually wean out.  Update me as needed.  He agrees.  He can try icing as needed in the meantime.

## 2019-09-07 ENCOUNTER — Other Ambulatory Visit: Payer: Self-pay

## 2019-09-07 ENCOUNTER — Ambulatory Visit
Admission: RE | Admit: 2019-09-07 | Discharge: 2019-09-07 | Disposition: A | Discharge status: Home / Self Care | Payer: BC Managed Care – PPO | Source: Ambulatory Visit | Attending: Primary Care | Admitting: Primary Care

## 2019-09-07 ENCOUNTER — Encounter: Payer: Self-pay | Admitting: Primary Care

## 2019-09-07 ENCOUNTER — Ambulatory Visit (INDEPENDENT_AMBULATORY_CARE_PROVIDER_SITE_OTHER): Payer: BC Managed Care – PPO | Admitting: Primary Care

## 2019-09-07 DIAGNOSIS — R1031 Right lower quadrant pain: Secondary | ICD-10-CM | POA: Insufficient documentation

## 2019-09-07 MED ORDER — IOHEXOL 300 MG/ML  SOLN
100.0000 mL | Freq: Once | INTRAMUSCULAR | Status: AC | PRN
  Administered 2019-09-07: 100 mL via INTRAVENOUS

## 2019-09-07 NOTE — Patient Instructions (Signed)
Stop by the lab prior to leaving today. I will notify you of your results once received.   Stop by the front desk and speak with either Shirlee Limerick or Charmaine regarding your CT scan.  Go to the hospital if your symptoms progress, you develop fevers, you feel worse.  It was a pleasure to see you today!

## 2019-09-07 NOTE — Assessment & Plan Note (Signed)
Acute for the last three days, no improvement or progression. He appears stable today, lacks any other symptoms except for pain.  Check labs including CBC and CMP. Stat CT abdomen/pelvis ordered.  Need to rule out acute appendicitis, especially given family history and symptoms.   Strict ED precautions provided, he verbalized understanding. Today he appears stable for outpatient treatment.

## 2019-09-07 NOTE — Telephone Encounter (Signed)
Johny Drilling, can you schedule him at 3:20 pm for today?

## 2019-09-07 NOTE — Progress Notes (Signed)
Subjective:    Patient ID: Anthony Ochoa, male    DOB: 1986/05/25, 34 y.o.   MRN: [id number]  HPI  This visit occurred during the SARS-CoV-2 public health emergency.  Safety protocols were in place, including screening questions prior to the visit, additional usage of staff PPE, and extensive cleaning of exam room while observing appropriate contact time as indicated for disinfecting solutions.   Anthony Ochoa is a 34 year old male with a history of GERD, migraines who presents today with a chief complaint of abdominal pain.   His pain is located to the right lower quadrant which he describes as achy. He will notice a sharp radiation of pain to the right lateral umbilicus when he applies pressure, also with radiation straight through his abdomen to his right lower back. Symptoms began three days ago.  He noticed slight constipation a few days ago which has resolved. He denies fevers, nausea, vomiting, trauma/injury, heavy lifting, diarrhea, urinary symptoms. Overall symptoms have not progressed but have not improved nor abated.   He has a family history of appendicitis in his maternal grandfather and maternal aunt, also with diverticulosis in grandparents.   Review of Systems  Constitutional: Negative for fever.  Gastrointestinal: Positive for abdominal pain. Negative for blood in stool, constipation, diarrhea, nausea and vomiting.       Past Medical History:  Diagnosis Date  . Allergy   . Chickenpox   . Croup   . GERD (gastroesophageal reflux disease)   . Migraines      Social History   Socioeconomic History  . Marital status: Married    Spouse name: Not on file  . Number of children: Not on file  . Years of education: Not on file  . Highest education level: Not on file  Occupational History  . Not on file  Tobacco Use  . Smoking status: Never Smoker  . Smokeless tobacco: Never Used  Substance and Sexual Activity  . Alcohol use: Yes  . Drug use: Never  . Sexual  activity: Not on file  Other Topics Concern  . Not on file  Social History Narrative   Married.   2 children.   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    Social Determinants of Health   Financial Resource Strain:   . Difficulty of Paying Living Expenses: Not on file  Food Insecurity:   . Worried About Programme researcher, broadcasting/film/video in the Last Year: Not on file  . Ran Out of Food in the Last Year: Not on file  Transportation Needs:   . Lack of Transportation (Medical): Not on file  . Lack of Transportation (Non-Medical): Not on file  Physical Activity:   . Days of Exercise per Week: Not on file  . Minutes of Exercise per Session: Not on file  Stress:   . Feeling of Stress : Not on file  Social Connections:   . Frequency of Communication with Friends and Family: Not on file  . Frequency of Social Gatherings with Friends and Family: Not on file  . Attends Religious Services: Not on file  . Active Member of Clubs or Organizations: Not on file  . Attends Banker Meetings: Not on file  . Marital Status: Not on file  Intimate Partner Violence:   . Fear of Current or Ex-Partner: Not on file  . Emotionally Abused: Not on file  . Physically Abused: Not on file  . Sexually Abused: Not on file    Past Surgical History:  Procedure Laterality Date  . TONSILLECTOMY  2002    Family History  Problem Relation Age of Onset  . Miscarriages / [place] Mother   . Alcohol abuse Father   . Hyperlipidemia Maternal Grandmother   . Hypertension Maternal Grandmother   . Heart disease Maternal Grandfather   . Hyperlipidemia Maternal Grandfather   . Hypertension Maternal Grandfather   . Stroke Maternal Grandfather     Allergies  Allergen Reactions  . Penicillins Rash    Current Outpatient Medications on File Prior to Visit  Medication Sig Dispense Refill  . albuterol (PROVENTIL HFA;VENTOLIN HFA) 108 (90 Base) MCG/ACT inhaler Inhale into the lungs every 6 (six) hours as needed for  wheezing or shortness of breath.    Marland Kitchen azelastine (OPTIVAR) 0.05 % ophthalmic solution   3  . fluticasone (FLONASE) 50 MCG/ACT nasal spray Place into both nostrils daily.    Marland Kitchen guaiFENesin (MUCINEX) 600 MG 12 hr tablet Take by mouth 2 (two) times daily.    Marland Kitchen loratadine (CLARITIN) 10 MG tablet Take 10 mg by mouth daily.    . rizatriptan (MAXALT) 10 MG tablet Take 1 tablet (10 mg total) by mouth as needed for migraine. May repeat in 2 hours if needed 10 tablet 0   No current facility-administered medications on file prior to visit.    BP 124/80   Pulse 82   Temp (!) 97 F (36.1 C) (Temporal)   Ht 5\' 10"  (1.778 m)   Wt 253 lb 8 oz (115 kg)   SpO2 98%   BMI 36.37 kg/m    Objective:   Physical Exam  Constitutional: He appears well-nourished.  Cardiovascular: Normal rate.  Respiratory: Effort normal.  GI: Soft. Bowel sounds are normal. There is abdominal tenderness in the right upper quadrant and right lower quadrant. There is no rebound and no guarding.    Skin: Skin is warm and dry.           Assessment & Plan:

## 2019-09-08 ENCOUNTER — Ambulatory Visit: Payer: BC Managed Care – PPO | Admitting: Primary Care

## 2019-09-08 LAB — CBC WITH DIFFERENTIAL/PLATELET
Basophils Absolute: 0.1 10*3/uL (ref 0.0–0.1)
Basophils Relative: 0.7 % (ref 0.0–3.0)
Eosinophils Absolute: 0.2 10*3/uL (ref 0.0–0.7)
Eosinophils Relative: 2.4 % (ref 0.0–5.0)
HCT: 46.6 % (ref 39.0–52.0)
Hemoglobin: 15.8 g/dL (ref 13.0–17.0)
Lymphocytes Relative: 29.8 % (ref 12.0–46.0)
Lymphs Abs: 2.4 10*3/uL (ref 0.7–4.0)
MCHC: 34 g/dL (ref 30.0–36.0)
MCV: 87.8 fl (ref 78.0–100.0)
Monocytes Absolute: 0.9 10*3/uL (ref 0.1–1.0)
Monocytes Relative: 10.8 % (ref 3.0–12.0)
Neutro Abs: 4.6 10*3/uL (ref 1.4–7.7)
Neutrophils Relative %: 56.3 % (ref 43.0–77.0)
Platelets: 298 10*3/uL (ref 150.0–400.0)
RBC: 5.31 Mil/uL (ref 4.22–5.81)
RDW: 13.7 % (ref 11.5–15.5)
WBC: 8.2 10*3/uL (ref 4.0–10.5)

## 2019-09-08 LAB — COMPREHENSIVE METABOLIC PANEL
ALT: 32 U/L (ref 0–53)
AST: 18 U/L (ref 0–37)
Albumin: 4.4 g/dL (ref 3.5–5.2)
Alkaline Phosphatase: 90 U/L (ref 39–117)
BUN: 12 mg/dL (ref 6–23)
CO2: 31 mEq/L (ref 19–32)
Calcium: 9.2 mg/dL (ref 8.4–10.5)
Chloride: 101 mEq/L (ref 96–112)
Creatinine, Ser: 0.99 mg/dL (ref 0.40–1.50)
GFR: 86.06 mL/min (ref >60.00)
Glucose, Bld: 94 mg/dL (ref 70–99)
Potassium: 4.5 mEq/L (ref 3.5–5.1)
Sodium: 139 mEq/L (ref 135–145)
Total Bilirubin: 0.8 mg/dL (ref 0.2–1.2)
Total Protein: 7.4 g/dL (ref 6.0–8.3)

## 2020-01-17 ENCOUNTER — Ambulatory Visit: Payer: BC Managed Care – PPO | Admitting: Primary Care

## 2020-01-17 ENCOUNTER — Other Ambulatory Visit: Payer: Self-pay

## 2020-01-17 ENCOUNTER — Encounter: Payer: Self-pay | Admitting: Primary Care

## 2020-01-17 VITALS — BP 112/70 | HR 77 | Temp 97.1°F | Ht 70.0 in | Wt 248.0 lb

## 2020-01-17 DIAGNOSIS — J3089 Other allergic rhinitis: Secondary | ICD-10-CM | POA: Diagnosis not present

## 2020-01-17 MED ORDER — MONTELUKAST SODIUM 10 MG PO TABS: 10.0000 mg | ORAL_TABLET | Freq: Every day | ORAL | 0 refills | Status: DC

## 2020-01-17 NOTE — Assessment & Plan Note (Signed)
Chronic, increased with what now seems to be a sinusitis.  Improving on clindamycin.  Discussed treatment options for chronic allergies, he found no improvement with Xyzal.  Recommend we continue Claritin, add Singulair at bedtime.  He will update.  He may actually require a course of Augmentin if symptoms return, but he will update in a few days.

## 2020-01-17 NOTE — Progress Notes (Signed)
Subjective:    Patient ID: Anthony Ochoa, male    DOB: 21-May-1986, 34 y.o.   MRN: [id number]  HPI  This visit occurred during the SARS-CoV-2 public health emergency.  Safety protocols were in place, including screening questions prior to the visit, additional usage of staff PPE, and extensive cleaning of exam room while observing appropriate contact time as indicated for disinfecting solutions.   Anthony Ochoa is a 34 year old male with a history of migraines, GERD, seasonal allergies who presents today with a chief complaint of dental pain.  His pain is located to the first left upper molar that began about three weeks ago. Very sensitive to warm and cold sensations, pain with chewing. He was evaluated by his dentist nearly two weeks ago, completed xrays, etc and found no dental etiology for his symptoms. He was told by his dentist that the pain may be caused by sinus pressure that was hitting a nerve.   He was prescribed Clindamycin for presumed infection for which he started taking this six days ago as pain escalated. Within a few days hist pain started to decrease, he's actually feeling better now, but still with residual symptoms. He's also taking Tylenol and Advil as needed. He's nearly finished with his clindamycin. He is scheduled to see his dentist again this morning.   He endorses chronic sinus pressure, rhinorrhea, post nasal drip, itchy/watery eyes, left eye drainage (clear). He saw an allergist a few years ago, was placed on Xyzal, didn't notice much of a difference. It was recommended he try the allergy injections, but he didn't want to pursue.  Strong family history of environmental and seasonal allergies in his mother.  Review of Systems  Constitutional: Negative for fever.  HENT: Positive for congestion, postnasal drip, rhinorrhea, sinus pressure and sneezing.   Eyes: Positive for itching.  Respiratory: Negative for cough.   Allergic/Immunologic: Positive for environmental  allergies.       Past Medical History:  Diagnosis Date  . Allergy   . Chickenpox   . Croup   . GERD (gastroesophageal reflux disease)   . Migraines      Social History   Socioeconomic History  . Marital status: Married    Spouse name: Not on file  . Number of children: Not on file  . Years of education: Not on file  . Highest education level: Not on file  Occupational History  . Not on file  Tobacco Use  . Smoking status: Never Smoker  . Smokeless tobacco: Never Used  Substance and Sexual Activity  . Alcohol use: Yes  . Drug use: Never  . Sexual activity: Not on file  Other Topics Concern  . Not on file  Social History Narrative   Married.   2 children.   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    Social Determinants of Health   Financial Resource Strain:   . Difficulty of Paying Living Expenses:   Food Insecurity:   . Worried About Charity fundraiser in the Last Year:   . Arboriculturist in the Last Year:   Transportation Needs:   . Film/video editor (Medical):   Marland Kitchen Lack of Transportation (Non-Medical):   Physical Activity:   . Days of Exercise per Week:   . Minutes of Exercise per Session:   Stress:   . Feeling of Stress :   Social Connections:   . Frequency of Communication with Friends and Family:   . Frequency of Social Gatherings  with Friends and Family:   . Attends Religious Services:   . Active Member of Clubs or Organizations:   . Attends Banker Meetings:   Marland Kitchen Marital Status:   Intimate Partner Violence:   . Fear of Current or Ex-Partner:   . Emotionally Abused:   Marland Kitchen Physically Abused:   . Sexually Abused:     Past Surgical History:  Procedure Laterality Date  . TONSILLECTOMY  2002    Family History  Problem Relation Age of Onset  . Miscarriages / [place] Mother   . Alcohol abuse Father   . Hyperlipidemia Maternal Grandmother   . Hypertension Maternal Grandmother   . Heart disease Maternal Grandfather   .  Hyperlipidemia Maternal Grandfather   . Hypertension Maternal Grandfather   . Stroke Maternal Grandfather     Allergies  Allergen Reactions  . Penicillins Rash    Current Outpatient Medications on File Prior to Visit  Medication Sig Dispense Refill  . albuterol (PROVENTIL HFA;VENTOLIN HFA) 108 (90 Base) MCG/ACT inhaler Inhale into the lungs every 6 (six) hours as needed for wheezing or shortness of breath.    . clindamycin (CLEOCIN) 300 MG capsule Take 300 mg by mouth 3 (three) times daily.    . fluticasone (FLONASE) 50 MCG/ACT nasal spray Place into both nostrils daily.    Marland Kitchen guaiFENesin (MUCINEX) 600 MG 12 hr tablet Take by mouth 2 (two) times daily.    Marland Kitchen loratadine (CLARITIN) 10 MG tablet Take 10 mg by mouth daily.    . rizatriptan (MAXALT) 10 MG tablet Take 1 tablet (10 mg total) by mouth as needed for migraine. May repeat in 2 hours if needed 10 tablet 0   No current facility-administered medications on file prior to visit.    BP 112/70 (BP Location: Left Arm, Patient Position: Sitting, Cuff Size: Large)   Pulse 77   Temp (!) 97.1 F (36.2 C)   Ht 5\' 10"  (1.778 m)   Wt 248 lb (112.5 kg)   SpO2 97%   BMI 35.58 kg/m    Objective:   Physical Exam HENT:     Nose:     Right Sinus: No maxillary sinus tenderness or frontal sinus tenderness.     Left Sinus: Maxillary sinus tenderness present. No frontal sinus tenderness.  Cardiovascular:     Rate and Rhythm: Normal rate and regular rhythm.  Pulmonary:     Effort: Pulmonary effort is normal.     Breath sounds: Normal breath sounds.  Musculoskeletal:     Cervical back: Neck supple.  Skin:    General: Skin is warm and dry.            Assessment & Plan:

## 2020-01-17 NOTE — Patient Instructions (Addendum)
Complete the clindamycin as prescribed.  Continue Claritin daily. Continue Flonase daily.  Start montelukast (Singulair) 10 mg once nightly for allergies.   Please update me in 2 weeks as discussed, sooner if you don't continue to improve.   It was a pleasure to see you today!

## 2020-02-25 ENCOUNTER — Other Ambulatory Visit: Payer: Self-pay

## 2020-02-25 DIAGNOSIS — J3089 Other allergic rhinitis: Secondary | ICD-10-CM

## 2020-02-25 MED ORDER — MONTELUKAST SODIUM 10 MG PO TABS: 10.0000 mg | ORAL_TABLET | Freq: Every day | ORAL | 0 refills | Status: DC

## 2020-03-24 ENCOUNTER — Telehealth: Payer: BC Managed Care – PPO | Admitting: Registered Nurse

## 2020-03-24 ENCOUNTER — Other Ambulatory Visit: Payer: Self-pay

## 2020-03-24 ENCOUNTER — Ambulatory Visit: Payer: BC Managed Care – PPO

## 2020-03-24 ENCOUNTER — Encounter: Payer: Self-pay | Admitting: Registered Nurse

## 2020-03-24 VITALS — HR 94 | Resp 16

## 2020-03-24 DIAGNOSIS — Z20822 Contact with and (suspected) exposure to covid-19: Secondary | ICD-10-CM

## 2020-03-24 DIAGNOSIS — J00 Acute nasopharyngitis [common cold]: Secondary | ICD-10-CM

## 2020-03-24 DIAGNOSIS — R197 Diarrhea, unspecified: Secondary | ICD-10-CM

## 2020-03-24 DIAGNOSIS — R5383 Other fatigue: Secondary | ICD-10-CM

## 2020-03-24 DIAGNOSIS — R059 Cough, unspecified: Secondary | ICD-10-CM

## 2020-03-24 LAB — POC COVID19 BINAXNOW: SARS Coronavirus 2 Ag: NEGATIVE

## 2020-03-24 NOTE — Progress Notes (Signed)
Subjective:    Patient ID: Anthony Ochoa, male    DOB: 05/17/1986, 34 y.o.   MRN: [id number]  34y/o caucasian male established patient fully covid vaccinated here for covid testing rapid due to cough started 03/21/2020 the next day 03/22/2020 nausea, diarrhea, rhinitis, fatigue, sore throat.  Slept 14 hours straight yeterday.  Stools brown loose.  Cough and runny nose clear.  Feels run ragged achy and fatigue improved from 8/10 yesterday to 6/10 today. Does not work weekends.  Patient consented to telephone visit.  This visit was conducted entirely via telephone/audio.  I spent 11 minutes on the telephone with patient. Patient completed self swab under observation by RN Eddie Candle in parking lot prior to telehealth visit.  Respirations even and unlabored, skin warm dry and pain, gait sure and steady in parking lot     Review of Systems  Constitutional: Positive for activity change, appetite change, chills and fatigue. Negative for diaphoresis and fever.  HENT: Positive for postnasal drip, rhinorrhea and sore throat.   Eyes: Negative for photophobia and visual disturbance.  Respiratory: Positive for cough. Negative for shortness of breath, wheezing and stridor.   Cardiovascular: Negative for chest pain.  Gastrointestinal: Positive for diarrhea and nausea. Negative for vomiting.  Endocrine: Negative for cold intolerance and heat intolerance.  Genitourinary: Negative for difficulty urinating.  Musculoskeletal: Positive for myalgias. Negative for arthralgias and gait problem.  Skin: Negative for rash.  Allergic/Immunologic: Positive for environmental allergies. Negative for food allergies.  Neurological: Negative for tremors, seizures, syncope, speech difficulty, weakness and headaches.  Hematological: Negative for adenopathy. Does not bruise/bleed easily.  Psychiatric/Behavioral: Negative for agitation, confusion and sleep disturbance.       Objective:   Physical Exam Vitals and nursing note  reviewed.  Constitutional:      General: He is awake. He is not in acute distress.    Appearance: Normal appearance. He is well-developed, well-groomed and overweight. He is not ill-appearing, toxic-appearing or diaphoretic.  HENT:     Head: Normocephalic and atraumatic.     Jaw: There is normal jaw occlusion.     Right Ear: Hearing and external ear normal.     Left Ear: Hearing and external ear normal.     Nose: Nose normal.     Mouth/Throat:     Mouth: No angioedema.     Pharynx: Oropharynx is clear.  Eyes:     General: Lids are normal. Vision grossly intact. Gaze aligned appropriately. No scleral icterus.       Right eye: No discharge.        Left eye: No discharge.     Extraocular Movements: Extraocular movements intact.     Conjunctiva/sclera: Conjunctivae normal.     Pupils: Pupils are equal, round, and reactive to light.  Neck:     Trachea: Trachea and phonation normal.  Cardiovascular:     Rate and Rhythm: Normal rate and regular rhythm.  Pulmonary:     Effort: Pulmonary effort is normal. No respiratory distress.     Breath sounds: Normal breath sounds and air entry. No stridor or transmitted upper airway sounds. No wheezing.     Comments: Rare mild cough during telephone conversation and throat/nasal clearing; spoke full sentences without difficulty; respirations even and unlabored Abdominal:     General: Abdomen is flat.  Musculoskeletal:        General: No deformity or signs of injury. Normal range of motion.     Right shoulder: No deformity or laceration. Normal range  of motion.     Left shoulder: No deformity or laceration. Normal range of motion.     Right elbow: No deformity or lacerations. Normal range of motion.     Left elbow: No deformity or lacerations. Normal range of motion.     Right hand: No deformity or lacerations. Normal range of motion.     Left hand: No deformity or lacerations. Normal range of motion.     Cervical back: Normal range of motion. No  edema, deformity, erythema, signs of trauma or rigidity. Normal range of motion.     Thoracic back: No deformity, signs of trauma or lacerations. Normal range of motion.     Lumbar back: No deformity, signs of trauma or lacerations. Normal range of motion.     Right hip: No deformity or lacerations.     Left hip: No deformity or lacerations.     Right knee: No deformity or lacerations.     Left knee: No deformity or lacerations.  Skin:    General: Skin is warm and dry.     Coloration: Skin is not ashen, cyanotic, jaundiced, mottled, pale or sallow.     Findings: No abrasion, abscess, acne, bruising, burn, ecchymosis, erythema, signs of injury, laceration, lesion, petechiae, rash or wound.     Nails: There is no clubbing.  Neurological:     General: No focal deficit present.     Mental Status: He is alert and oriented to person, place, and time. Mental status is at baseline.     GCS: GCS eye subscore is 4. GCS verbal subscore is 5. GCS motor subscore is 6.     Cranial Nerves: No dysarthria or facial asymmetry.     Motor: Motor function is intact. No weakness, tremor, atrophy, abnormal muscle tone or seizure activity.     Coordination: Coordination is intact. Coordination normal.     Gait: Gait is intact. Gait normal.     Comments: Gait sure and steady in parking lot  Psychiatric:        Attention and Perception: Attention and perception normal.        Mood and Affect: Mood and affect normal.        Speech: Speech normal.        Behavior: Behavior normal. Behavior is cooperative.        Thought Content: Thought content normal.        Cognition and Memory: Cognition and memory normal.        Judgment: Judgment normal.           Assessment & Plan:  A-diarrhea, acute rhinitis, cough  P-Rapid covid test negative Binaxnow in clinic. Fully covid vaccinated.  Pfizer booster will be due in December base on current guidance 8 months after 2nd dose.  PCR sent to [facility] typically results  in 2-3 days at this time.  Will populate to mychart when results.  I will call if positive also.  I do not recommend family testing at this time if his PCR positive yes or if others symptomatic on days 3-5 of symptoms.  [place] policy based on respiratory symptoms only could return to work today since vaccinated but due to diarrhea stay home until none in previous 24 hours along with fever/chills.  Work excuse x 24 hours due to diarrhea today not scheduled to work this weekend next scheduled workday 27 Mar 2020 and if still having diarrhea to contact clinic for work excuse extension/re-evaluation.  Patient to wear mask, not share eating  utensils/kiss others until diarrhea/rhinitis/nausea/cough resolved, wash hands frequently, sanitize/clean high touch surfaces at home door handles/faucet handles.  Fatigue probably related to cough and diarrhea/viral illness.  Delta variant covid, RSV, flu, common cold, viral gastroenteritis still circulating in community.  I have recommended clear fluids and advanced to soft as tolerated.  Avoid dairy, spicy and fried foods until diarrhea resolves. Avoid dehydration drink noncaffeinated beverages (water, ginger ale, soup broth, popsicles, no sugar added gatorade/powerade) to urinate every 2-4 hours pale yellow urine.  It is easy to become dehydrated when having diarrhea along with electrolyte imbalances.  Patient to take  temperature and if less than 100.5 F may take over the counter Imodium per manufacturer's instructions 2mg  tabs take 2 with first loose stool then may take 1 with each successive stool max 8 tabs per 24 hours.  Exitcare handouts on viral gastroenteritis, diarrhea and foods to relieve diarrhea. Medications as directed.  Return to the clinic if symptoms persist or worsen; I have alerted the patient to call if high fever, dehydration, marked weakness, fainting, increased abdominal pain, blood in stool or vomit (bright red/coffee ground or tarry).  Patient verbalized  agreement and understanding of treatment plan and had no further questions at this time. P2: Hand washing and fitness  Consider restarting singulair 10mg  po daily.  Patient may use normal saline nasal spray 2 sprays each nostril q2h wa as needed. flonase 1 spray each nostril BID.  Patient denied personal or family history of ENT cancer.  OTC antihistamine of choice claritin 10mg  po daily.  Avoid triggers if possible.  Shower prior to bedtime if exposed to triggers.  If allergic dust/dust mites recommend mattress/pillow covers/encasements; washing linens, vacuuming, sweeping, dusting weekly.  Call or return to clinic as needed if these symptoms worsen or fail to improve as anticipated.   Exitcare handouts on nonallergic rhinitis, allergic rhinitis and sinus rinse.  Patient verbalized understanding of instructions, agreed with plan of care and had no further questions at this time.   Cough lozenges po prn per manufacturers instructions or cough syrup.  Consider honey with lemon. Albuterol MDI 1-2 puffs po q4-6h prn protracted cough/wheeze side effect increased heart rate. Most likely post nasal drip induced/irritation at this time but may progress to bronchitis simple  Viruses are the most common cause of bronchial inflammation in otherwise healthy adults with acute bronchitis.  The appearance of sputum is not predictive of whether a bacterial infection is present.  Purulent sputum is most often caused by viral infections.  There are a small portion of those caused by non-viral agents being Mycoplama pneumonia.  Microscopic examination or C&S of sputum in the healthy adult with acute bronchitis is generally not helpful (usually negative or normal respiratory flora) other considerations being cough from upper respiratory tract infections, sinusitis or allergic syndromes (mild asthma or viral pneumonia).  Differential Diagnoses:  reactive airway disease (asthma, allergic aspergillosis (eosinophilia),  chronic bronchitis, respiratory infection (sinusitis, common cold, pneumonia), congestive heart failure, reflux esophagitis, bronchogenic tumor, aspiration syndromes and/or exposure to pulmonary irritants/smoke.  Without high fever, severe dyspnea, lack of physical findings or other risk factors, I will hold on a chest radiograph and CBC at this time.  I discussed that approximately 50% of patients with acute bronchitis have a cough that lasts up to three weeks, and 25% for over a month.  Tylenol 500mg  one to two tablets every four to six hours as needed for fever or myalgias.   Exitcare handout on cough and  inhaler use.  ER if hemopthysis, SOB, worst chest pain of life.   Patient instructed to follow up if symptoms worsen or not improving as expected with plan of care.  Patient verbalized agreement and understanding of treatment plan and had no further questions at this time.  P2:  hand washing and cover cough P2:  Avoidance and hand washing.

## 2020-03-24 NOTE — Patient Instructions (Addendum)
Work excuse x 24 hours PCR test results pending typically 2-3 days from [facility] Frequent handwashing, wear mask, social distance and high touch surfaces at home frequent cleaning AvoidAllergic Rhinitis, Adult Allergic rhinitis is an allergic reaction that affects the mucous membrane inside the nose. It causes sneezing, a runny or stuffy nose, and the feeling of mucus going down the back of the throat (postnasal drip). Allergic rhinitis can be mild to severe. There are two types of allergic rhinitis:  Seasonal. This type is also called hay fever. It happens only during certain seasons.  Perennial. This type can happen at any time of the year. What are the causes? This condition happens when the body's defense system (immune system) responds to certain harmless substances called allergens as though they were germs.  Seasonal allergic rhinitis is triggered by pollen, which can come from grasses, trees, and weeds. Perennial allergic rhinitis may be caused by:  House dust mites.  Pet dander.  Mold spores. What are the signs or symptoms? Symptoms of this condition include:  Sneezing.  Runny or stuffy nose (nasal congestion).  Postnasal drip.  Itchy nose.  Tearing of the eyes.  Trouble sleeping.  Daytime sleepiness. How is this diagnosed? This condition may be diagnosed based on:  Your medical history.  A physical exam.  Tests to check for related conditions, such as: ? Asthma. ? Pink eye. ? Ear infection. ? Upper respiratory infection.  Tests to find out which allergens trigger your symptoms. These may include skin or blood tests. How is this treated? There is no cure for this condition, but treatment can help control symptoms. Treatment may include:  Taking medicines that block allergy symptoms, such as antihistamines. Medicine may be given as a shot, nasal spray, or pill.  Avoiding the allergen.  Desensitization. This treatment involves getting ongoing shots until  your body becomes less sensitive to the allergen. This treatment may be done if other treatments do not help.  If taking medicine and avoiding the allergen does not work, new, stronger medicines may be prescribed. Follow these instructions at home:  Find out what you are allergic to. Common allergens include smoke, dust, and pollen.  Avoid the things you are allergic to. These are some things you can do to help avoid allergens: ? Replace carpet with wood, tile, or vinyl flooring. Carpet can trap dander and dust. ? Do not smoke. Do not allow smoking in your home. ? Change your heating and air conditioning filter at least once a month. ? During allergy season:  Keep windows closed as much as possible.  Plan outdoor activities when pollen counts are lowest. This is usually during the evening hours.  When coming indoors, change clothing and shower before sitting on furniture or bedding.  Take over-the-counter and prescription medicines only as told by your health care provider.  Keep all follow-up visits as told by your health care provider. This is important. Contact a health care provider if:  You have a fever.  You develop a persistent cough.  You make whistling sounds when you breathe (you wheeze).  Your symptoms interfere with your normal daily activities. Get help right away if:  You have shortness of breath. Summary  This condition can be managed by taking medicines as directed and avoiding allergens.  Contact your health care provider if you develop a persistent cough or fever.  During allergy season, keep windows closed as much as possible. This information is not intended to replace advice given to you by  your health care provider. Make sure you discuss any questions you have with your health care provider. Document Revised: 06/20/2017 Document Reviewed: 08/15/2016 Elsevier Patient Education  2020 [employer] Nonallergic Rhinitis Nonallergic rhinitis is a  condition that causes symptoms that affect the nose, such as a runny nose and a stuffed-up nose (nasal congestion) that can make it hard to breathe through the nose. This condition is different from having an allergy (allergic rhinitis). Allergic rhinitis occurs when the body's defense system (immune system) reacts to a substance that you are allergic to (allergen), such as pollen, pet dander, mold, or dust. Nonallergic rhinitis has many similar symptoms, but it is not caused by allergens. Nonallergic rhinitis can be a short-term or long-term problem. What are the causes? This condition can be caused by many different things. Some common types of nonallergic rhinitis include: Infectious rhinitis  This is usually due to an infection in the upper respiratory tract. Vasomotor rhinitis  This is the most common type of long-term nonallergic rhinitis.  It is caused by too much blood flow through the nose, which makes the tissue inside of the nose swell.  Symptoms are often triggered by strong odors, cold air, stress, drinking alcohol, cigarette smoke, or changes in the weather. Occupational rhinitis  This type is caused by triggers in the workplace, such as chemicals, dusts, animal dander, or air pollution. Hormonal rhinitis  This type occurs in women as a result of an increase in the male hormone estrogen.  It may occur during pregnancy, puberty, and menstrual cycles.  Symptoms improve when estrogen levels drop. Drug-induced rhinitis Several drugs can cause nonallergic rhinitis, including:  Medicines that are used to treat high blood pressure, heart disease, and Parkinson disease.  Aspirin and NSAIDs.  Over-the-counter nasal decongestant sprays. These can cause a type of nonallergic rhinitis (rhinitis medicamentosa) when they are used for more than a few days. Nonallergic rhinitis with eosinophilia syndrome (NARES)  This type is caused by having too much of a certain type of white blood  cell (eosinophil). Nonallergic rhinitis can also be caused by a reaction to eating hot or spicy foods. This does not usually cause long-term symptoms. In some cases, the cause of nonallergic rhinitis is not known. What increases the risk? You are more likely to develop this condition if:  You are 22-74 years of age.  You are a woman. Women are twice as likely to have this condition. What are the signs or symptoms? Common symptoms of this condition include:  Nasal congestion.  Runny nose.  The feeling of mucus going down the back of the throat (postnasal drip).  Trouble sleeping at night and daytime sleepiness. Less common symptoms include:  Sneezing.  Coughing.  Itchy nose.  Bloodshot eyes. How is this diagnosed? This condition may be diagnosed based on:  Your symptoms and medical history.  A physical exam.  Allergy testing to rule out allergic rhinitis. You may have skin tests or blood tests. In some cases, the health care provider may take a swab of nasal secretions to look for an increased number of eosinophils. This would be done to confirm a diagnosis of NARES. How is this treated? Treatment for this condition depends on the cause. No single treatment works for everyone. Work with your health care provider to find the best treatment for you. Treatment may include:  Avoiding the things that trigger your symptoms.  Using medicines to relieve congestion, such as: ? Steroid nasal spray. There are many types. You may  need to try a few to find out which one works best. ? Decongestant medicine. This may be an oral medicine or a nasal spray. These medicines are only used for a short time.  Using medicines to relieve a runny nose. These may include antihistamine medicines or anticholinergic nasal sprays.  Surgery to remove tissue from inside the nose may be needed in severe cases if the condition has not improved after 6-12 months of medical treatment. Follow these  instructions at home:  Take or use over-the-counter and prescription medicines only as told by your health care provider. Do not stop using your medicine even if you start to feel better.  Use salt-water (saline) rinses or other solutions (nasal washes or irrigations) to wash or rinse out the inside of your nose as told by your health care provider.  Do not take NSAIDs or medicines that contain aspirin if they make your symptoms worse.  Do not drink alcohol if it makes your symptoms worse.  Do not use any tobacco products, such as cigarettes, chewing tobacco, and e-cigarettes. If you need help quitting, ask your health care provider.  Avoid secondhand smoke.  Get some exercise every day. Exercise may help reduce symptoms of nonallergic rhinitis for some people. Ask your health care provider how much exercise and what types of exercise are safe for you.  Sleep with the head of your bed raised (elevated). This may reduce nighttime nasal congestion.  Keep all follow-up visits as told by your health care provider. This is important. Contact a health care provider if:  You have a fever.  Your symptoms are getting worse at home.  Your symptoms are not responding to medicine.  You develop new symptoms, especially a headache or nosebleed. This information is not intended to replace advice given to you by your health care provider. Make sure you discuss any questions you have with your health care provider. Document Revised: 06/20/2017 Document Reviewed: 09/28/2015 Elsevier Patient Education  2020 [employer] Cough, Adult Coughing is a reflex that clears your throat and your airways (respiratory system). Coughing helps to heal and protect your lungs. It is normal to cough occasionally, but a cough that happens with other symptoms or lasts a long time may be a sign of a condition that needs treatment. An acute cough may only last 2-3 weeks, while a chronic cough may last 8 or more  weeks. Coughing is commonly caused by:  Infection of the respiratory systemby viruses or bacteria.  Breathing in substances that irritate your lungs.  Allergies.  Asthma.  Mucus that runs down the back of your throat (postnasal drip).  Smoking.  Acid backing up from the stomach into the esophagus (gastroesophageal reflux).  Certain medicines.  Chronic lung problems.  Other medical conditions such as heart failure or a blood clot in the lung (pulmonary embolism). Follow these instructions at home: Medicines  Take over-the-counter and prescription medicines only as told by your health care provider.  Talk with your health care provider before you take a cough suppressant medicine. Lifestyle   Avoid cigarette smoke. Do not use any products that contain nicotine or tobacco, such as cigarettes, e-cigarettes, and chewing tobacco. If you need help quitting, ask your health care provider.  Drink enough fluid to keep your urine pale yellow.  Avoid caffeine.  Do not drink alcohol if your health care provider tells you not to drink. General instructions   Pay close attention to changes in your cough. Tell your health care provider  about them.  Always cover your mouth when you cough.  Avoid things that make you cough, such as perfume, candles, cleaning products, or campfire or tobacco smoke.  If the air is dry, use a cool mist vaporizer or humidifier in your bedroom or your home to help loosen secretions.  If your cough is worse at night, try to sleep in a semi-upright position.  Rest as needed.  Keep all follow-up visits as told by your health care provider. This is important. Contact a health care provider if you:  Have new symptoms.  Cough up pus.  Have a cough that does not get better after 2-3 weeks or gets worse.  Cannot control your cough with cough suppressant medicines and you are losing sleep.  Have pain that gets worse or pain that is not helped with  medicine.  Have a fever.  Have unexplained weight loss.  Have night sweats. Get help right away if:  You cough up blood.  You have difficulty breathing.  Your heartbeat is very fast. These symptoms may represent a serious problem that is an emergency. Do not wait to see if the symptoms will go away. Get medical help right away. Call your local emergency services (911 in the [place] Do not drive yourself to the hospital. Summary  Coughing is a reflex that clears your throat and your airways. It is normal to cough occasionally, but a cough that happens with other symptoms or lasts a long time may be a sign of a condition that needs treatment.  Take over-the-counter and prescription medicines only as told by your health care provider.  Always cover your mouth when you cough.  Contact a health care provider if you have new symptoms or a cough that does not get better after 2-3 weeks or gets worse. This information is not intended to replace advice given to you by your health care provider. Make sure you discuss any questions you have with your health care provider. Document Revised: 07/27/2018 Document Reviewed: 07/27/2018 Elsevier Patient Education  2020 [employer] Inc. Viral Gastroenteritis, Adult  Viral gastroenteritis is also known as the stomach flu. This condition may affect your stomach, small intestine, and large intestine. It can cause sudden watery diarrhea, fever, and vomiting. This condition is caused by many different viruses. These viruses can be passed from person to person very easily (are contagious). Diarrhea and vomiting can make you feel weak and cause you to become dehydrated. You may not be able to keep fluids down. Dehydration can make you tired and thirsty, cause you to have a dry mouth, and decrease how often you urinate. It is important to replace the fluids that you lose from diarrhea and vomiting. What are the causes? Gastroenteritis is caused by many viruses,  including rotavirus and norovirus. Norovirus is the most common cause in adults. You can get sick after being exposed to the viruses from other people. You can also get sick by:  Eating food, drinking water, or touching a surface contaminated with one of these viruses.  Sharing utensils or other personal items with an infected person. What increases the risk? You are more likely to develop this condition if you:  Have a weak body defense system (immune system).  Live with one or more children who are younger than 60 years old.  Live in a nursing home.  Travel on cruise ships. What are the signs or symptoms? Symptoms of this condition start suddenly 1-3 days after exposure to a virus. Symptoms may  last for a few days or for as long as a week. Common symptoms include watery diarrhea and vomiting. Other symptoms include:  Fever.  Headache.  Fatigue.  Pain in the abdomen.  Chills.  Weakness.  Nausea.  Muscle aches.  Loss of appetite. How is this diagnosed? This condition is diagnosed with a medical history and physical exam. You may also have a stool test to check for viruses or other infections. How is this treated? This condition typically goes away on its own. The focus of treatment is to prevent dehydration and restore lost fluids (rehydration). This condition may be treated with:  An oral rehydration solution (ORS) to replace important salts and minerals (electrolytes) in your body. Take this if told by your health care provider. This is a drink that is sold at pharmacies and retail stores.  Medicines to help with your symptoms.  Probiotic supplements to reduce symptoms of diarrhea.  Fluids given through an IV, if dehydration is severe. Older adults and people with other diseases or a weak immune system are at higher risk for dehydration. Follow these instructions at home:  Eating and drinking   Take an ORS as told by your health care provider.  Drink clear  fluids in small amounts as you are able. Clear fluids include: ? Water. ? Ice chips. ? Diluted fruit juice. ? Low-calorie sports drinks.  Drink enough fluid to keep your urine pale yellow.  Eat small amounts of healthy foods every 3-4 hours as you are able. This may include whole grains, fruits, vegetables, lean meats, and yogurt.  Avoid fluids that contain a lot of sugar or caffeine, such as energy drinks, sports drinks, and soda.  Avoid spicy or fatty foods.  Avoid alcohol. General instructions  Wash your hands often, especially after having diarrhea or vomiting. If soap and water are not available, use hand sanitizer.  Make sure that all people in your household wash their hands well and often.  Take over-the-counter and prescription medicines only as told by your health care provider.  Rest at home while you recover.  Watch your condition for any changes.  Take a warm bath to relieve any burning or pain from frequent diarrhea episodes.  Keep all follow-up visits as told by your health care provider. This is important. Contact a health care provider if you:  Cannot keep fluids down.  Have symptoms that get worse.  Have new symptoms.  Feel light-headed or dizzy.  Have muscle cramps. Get help right away if you:  Have chest pain.  Feel extremely weak or you faint.  See blood in your vomit.  Have vomit that looks like coffee grounds.  Have bloody or black stools or stools that look like tar.  Have a severe headache, a stiff neck, or both.  Have a rash.  Have severe pain, cramping, or bloating in your abdomen.  Have trouble breathing or you are breathing very quickly.  Have a fast heartbeat.  Have skin that feels cold and clammy.  Feel confused.  Have pain when you urinate.  Have signs of dehydration, such as: ? Dark urine, very little urine, or no urine. ? Cracked lips. ? Dry mouth. ? Sunken eyes. ? Sleepiness. ? Weakness. Summary  Viral  gastroenteritis is also known as the stomach flu. It can cause sudden watery diarrhea, fever, and vomiting.  This condition can be passed from person to person very easily (is contagious).  Take an ORS if told by your health care provider. This  is a drink that is sold at pharmacies and retail stores.  Wash your hands often, especially after having diarrhea or vomiting. If soap and water are not available, use hand sanitizer. This information is not intended to replace advice given to you by your health care provider. Make sure you discuss any questions you have with your health care provider. Document Revised: 12/25/2018 Document Reviewed: 05/13/2018 Elsevier Patient Education  2020 [employer]. Food Choices to Help Relieve Diarrhea, Adult When you have diarrhea, the foods you eat and your eating habits are very important. Choosing the right foods and drinks can help:  Relieve diarrhea.  Replace lost fluids and nutrients.  Prevent dehydration. What general guidelines should I follow?  Relieving diarrhea  Choose foods with less than 2 g or .07 oz. of fiber per serving.  Limit fats to less than 8 tsp (38 g or 1.34 oz.) a day.  Avoid the following: ? Foods and beverages sweetened with high-fructose corn syrup, honey, or sugar alcohols such as xylitol, sorbitol, and mannitol. ? Foods that contain a lot of fat or sugar. ? Fried, greasy, or spicy foods. ? High-fiber grains, breads, and cereals. ? Raw fruits and vegetables.  Eat foods that are rich in probiotics. These foods include dairy products such as yogurt and fermented milk products. They help increase healthy bacteria in the stomach and intestines (gastrointestinal tract, or GI tract).  If you have lactose intolerance, avoid dairy products. These may make your diarrhea worse.  Take medicine to help stop diarrhea (antidiarrheal medicine) only as told by your health care provider. Replacing nutrients  Eat small meals or  snacks every 3-4 hours.  Eat bland foods, such as white rice, toast, or baked potato, until your diarrhea starts to get better. Gradually reintroduce nutrient-rich foods as tolerated or as told by your health care provider. This includes: ? Well-cooked protein foods. ? Peeled, seeded, and soft-cooked fruits and vegetables. ? Low-fat dairy products.  Take vitamin and mineral supplements as told by your health care provider. Preventing dehydration  Start by sipping water or a special solution to prevent dehydration (oral rehydration solution, ORS). Urine that is clear or pale yellow means that you are getting enough fluid.  Try to drink at least 8-10 cups of fluid each day to help replace lost fluids.  You may add other liquids in addition to water, such as clear juice or decaffeinated sports drinks, as tolerated or as told by your health care provider.  Avoid drinks with caffeine, such as coffee, tea, or soft drinks.  Avoid alcohol. What foods are recommended?     The items listed may not be a complete list. Talk with your health care provider about what dietary choices are best for you. Grains White rice. White, [place], or pita breads (fresh or toasted), including plain rolls, buns, or bagels. White pasta. Saltine, soda, or graham crackers. Pretzels. Low-fiber cereal. Cooked cereals made with water (such as cornmeal, farina, or cream cereals). Plain muffins. Matzo. Melba toast. Zwieback. Vegetables Potatoes (without the skin). Most well-cooked and canned vegetables without skins or seeds. Tender lettuce. Fruits Apple sauce. Fruits canned in juice. Cooked apricots, cherries, grapefruit, peaches, pears, or plums. Fresh bananas and cantaloupe. Meats and other protein foods Baked or boiled chicken. Eggs. Tofu. Fish. Seafood. Smooth nut butters. Ground or well-cooked tender beef, ham, veal, lamb, pork, or poultry. Dairy Plain yogurt, kefir, and unsweetened liquid yogurt. Lactose-free  milk, buttermilk, skim milk, or soy milk. Low-fat or nonfat hard cheese.  Beverages Water. Low-calorie sports drinks. Fruit juices without pulp. Strained tomato and vegetable juices. Decaffeinated teas. Sugar-free beverages not sweetened with sugar alcohols. Oral rehydration solutions, if approved by your health care provider. Seasoning and other foods Bouillon, broth, or soups made from recommended foods. What foods are not recommended? The items listed may not be a complete list. Talk with your health care provider about what dietary choices are best for you. Grains Whole grain, whole wheat, bran, or rye breads, rolls, pastas, and crackers. Wild or brown rice. Whole grain or bran cereals. Barley. Oats and oatmeal. Corn tortillas or taco shells. Granola. Popcorn. Vegetables Raw vegetables. Fried vegetables. Cabbage, broccoli, Brussels sprouts, artichokes, baked beans, beet greens, corn, kale, legumes, peas, sweet potatoes, and yams. Potato skins. Cooked spinach and cabbage. Fruits Dried fruit, including raisins and dates. Raw fruits. Stewed or dried prunes. Canned fruits with syrup. Meat and other protein foods Fried or fatty meats. Deli meats. Chunky nut butters. Nuts and seeds. Beans and lentils. Tomasa Blase. Hot dogs. Sausage. Dairy High-fat cheeses. Whole milk, chocolate milk, and beverages made with milk, such as milk shakes. Half-and-half. Cream. sour cream. Ice cream. Beverages Caffeinated beverages (such as coffee, tea, soda, or energy drinks). Alcoholic beverages. Fruit juices with pulp. Prune juice. Soft drinks sweetened with high-fructose corn syrup or sugar alcohols. High-calorie sports drinks. Fats and oils Butter. Cream sauces. Margarine. Salad oils. Plain salad dressings. Olives. Avocados. Mayonnaise. Sweets and desserts Sweet rolls, doughnuts, and sweet breads. Sugar-free desserts sweetened with sugar alcohols such as xylitol and sorbitol. Seasoning and other foods Honey. Hot sauce.  Chili powder. Gravy. Cream-based or milk-based soups. Pancakes and waffles. Summary  When you have diarrhea, the foods you eat and your eating habits are very important.  Make sure you get at least 8-10 cups of fluid each day, or enough to keep your urine clear or pale yellow.  Eat bland foods and gradually reintroduce healthy, nutrient-rich foods as tolerated, or as told by your health care provider.  Avoid high-fiber, fried, greasy, or spicy foods. This information is not intended to replace advice given to you by your health care provider. Make sure you discuss any questions you have with your health care provider. Document Revised: 10/29/2018 Document Reviewed: 07/05/2016 Elsevier Patient Education  2020 [employer] Inc[employer] Diarrhea, Adult Diarrhea is frequent loose and watery bowel movements. Diarrhea can make you feel weak and cause you to become dehydrated. Dehydration can make you tired and thirsty, cause you to have a dry mouth, and decrease how often you urinate. Diarrhea typically lasts 2-3 days. However, it can last longer if it is a sign of something more serious. It is important to treat your diarrhea as told by your health care provider. Follow these instructions at home: Eating and drinking     Follow these recommendations as told by your health care provider:  Take an oral rehydration solution (ORS). This is an over-the-counter medicine that helps return your body to its normal balance of nutrients and water. It is found at pharmacies and retail stores.  Drink plenty of fluids, such as water, ice chips, diluted fruit juice, and low-calorie sports drinks. You can drink milk also, if desired.  Avoid drinking fluids that contain a lot of sugar or caffeine, such as energy drinks, sports drinks, and soda.  Eat bland, easy-to-digest foods in small amounts as you are able. These foods include bananas, applesauce, rice, lean meats, toast, and crackers.  Avoid alcohol.  Avoid  spicy or fatty foods.  Medicines  Take over-the-counter and prescription medicines only as told by your health care provider.  If you were prescribed an antibiotic medicine, take it as told by your health care provider. Do not stop using the antibiotic even if you start to feel better. General instructions   Wash your hands often using soap and water. If soap and water are not available, use a hand sanitizer. Others in the household should wash their hands as well. Hands should be washed: ? After using the toilet or changing a diaper. ? Before preparing, cooking, or serving food. ? While caring for a sick person or while visiting someone in a hospital.  Drink enough fluid to keep your urine pale yellow.  Rest at home while you recover.  Watch your condition for any changes.  Take a warm bath to relieve any burning or pain from frequent diarrhea episodes.  Keep all follow-up visits as told by your health care provider. This is important. Contact a health care provider if:  You have a fever.  Your diarrhea gets worse.  You have new symptoms.  You cannot keep fluids down.  You feel light-headed or dizzy.  You have a headache.  You have muscle cramps. Get help right away if:  You have chest pain.  You feel extremely weak or you faint.  You have bloody or black stools or stools that look like tar.  You have severe pain, cramping, or bloating in your abdomen.  You have trouble breathing or you are breathing very quickly.  Your heart is beating very quickly.  Your skin feels cold and clammy.  You feel confused.  You have signs of dehydration, such as: ? Dark urine, very little urine, or no urine. ? Cracked lips. ? Dry mouth. ? Sunken eyes. ? Sleepiness. ? Weakness. Summary  Diarrhea is frequent loose and watery bowel movements. Diarrhea can make you feel weak and cause you to become dehydrated.  Drink enough fluids to keep your urine pale yellow.  Make  sure that you wash your hands after using the toilet. If soap and water are not available, use hand sanitizer.  Contact a health care provider if your diarrhea gets worse or you have new symptoms.  Get help right away if you have signs of dehydration. This information is not intended to replace advice given to you by your health care provider. Make sure you discuss any questions you have with your health care provider. Document Revised: 11/24/2018 Document Reviewed: 12/12/2017 Elsevier Patient Education  2020 [employer]  How to Use a Metered Dose Inhaler A metered dose inhaler is a handheld device for taking medicine that must be breathed into the lungs (inhaled). The device can be used to deliver a variety of inhaled medicines, including:  Quick relief or rescue medicines, such as bronchodilators.  Controller medicines, such as corticosteroids. The medicine is delivered by pushing down on a metal canister to release a preset amount of spray and medicine. Each device contains the amount of medicine that is needed for a preset number of uses (inhalations). Your health care provider may recommend that you use a spacer with your inhaler to help you take the medicine more effectively. A spacer is a plastic tube with a mouthpiece on one end and an opening that connects to the inhaler on the other end. A spacer holds the medicine in a tube for a short time, which allows you to inhale more medicine. What are the risks? If you do not use  your inhaler correctly, medicine might not reach your lungs to help you breathe. Inhaler medicine can cause side effects, such as:  Mouth or throat infection.  Cough.  Hoarseness.  Headache.  Nausea and vomiting.  Lung infection (pneumonia) in people who have a lung condition called COPD. How to use a metered dose inhaler without a spacer  1. Remove the cap from the inhaler. 2. If you are using the inhaler for the first time, shake it for 5 seconds,  turn it away from your face, then release 4 puffs into the air. This is called priming. 3. Shake the inhaler for 5 seconds. 4. Position the inhaler so the top of the canister faces up. 5. Put your index finger on the top of the medicine canister. Support the bottom of the inhaler with your thumb. 6. Breathe out normally and as completely as possible, away from the inhaler. 7. Either place the inhaler between your teeth and close your lips tightly around the mouthpiece, or hold the inhaler 1-2 inches (2.5-5 cm) away from your open mouth. Keep your tongue down out of the way. If you are unsure which technique to use, ask your health care provider. 8. Press the canister down with your index finger to release the medicine, then inhale deeply and slowly through your mouth (not your nose) until your lungs are completely filled. Inhaling should take 4-6 seconds. 9. Hold the medicine in your lungs for 5-10 seconds (10 seconds is best). This helps the medicine get into the small airways of your lungs. 10. With your lips in a tight circle (pursed), breathe out slowly. 11. Repeat steps 3-10 until you have taken the number of puffs that your health care provider directed. Wait about 1 minute between puffs or as directed. 12. Put the cap on the inhaler. 13. If you are using a steroid inhaler, rinse your mouth with water, gargle, and spit out the water. Do not swallow the water. How to use a metered dose inhaler with a spacer  1. Remove the cap from the inhaler. 2. If you are using the inhaler for the first time, shake it for 5 seconds, turn it away from your face, then release 4 puffs into the air. This is called priming. 3. Shake the inhaler for 5 seconds. 4. Place the open end of the spacer onto the inhaler mouthpiece. 5. Position the inhaler so the top of the canister faces up and the spacer mouthpiece faces you. 6. Put your index finger on the top of the medicine canister. Support the bottom of the inhaler  and the spacer with your thumb. 7. Breathe out normally and as completely as possible, away from the spacer. 8. Place the spacer between your teeth and close your lips tightly around it. Keep your tongue down out of the way. 9. Press the canister down with your index finger to release the medicine, then inhale deeply and slowly through your mouth (not your nose) until your lungs are completely filled. Inhaling should take 4-6 seconds. 10. Hold the medicine in your lungs for 5-10 seconds (10 seconds is best). This helps the medicine get into the small airways of your lungs. 11. With your lips in a tight circle (pursed), breathe out slowly. 12. Repeat steps 3-11 until you have taken the number of puffs that your health care provider directed. Wait about 1 minute between puffs or as directed. 13. Remove the spacer from the inhaler and put the cap on the inhaler. 14. If you  are using a steroid inhaler, rinse your mouth with water, gargle, and spit out the water. Do not swallow the water. Follow these instructions at home:  Take your inhaled medicine only as told by your health care provider. Do not use the inhaler more than directed by your health care provider.  Keep all follow-up visits as told by your health care provider. This is important.  If your inhaler has a counter, you can check it to determine how full your inhaler is. If your inhaler does not have a counter, ask your health care provider when you will need to refill your inhaler and write the refill date on a calendar or on your inhaler canister. Note that you cannot know when an inhaler is empty by shaking it.  Follow directions on the package insert for care and cleaning of your inhaler and spacer. Contact a health care provider if:  Symptoms are only partially relieved with your inhaler.  You are having trouble using your inhaler.  You have an increase in phlegm.  You have headaches. Get help right away if:  You feel little  or no relief after using your inhaler.  You have dizziness.  You have a fast heart rate.  You have chills or a fever.  You have night sweats.  There is blood in your phlegm. Summary  A metered dose inhaler is a handheld device for taking medicine that must be breathed into the lungs (inhaled).  The medicine is delivered by pushing down on a metal canister to release a preset amount of spray and medicine.  Each device contains the amount of medicine that is needed for a preset number of uses (inhalations). This information is not intended to replace advice given to you by your health care provider. Make sure you discuss any questions you have with your health care provider. Document Revised: 06/20/2017 Document Reviewed: 05/28/2016 Elsevier Patient Education  2020 Elsevier Inc.  Cough, Adult Coughing is a reflex that clears your throat and your airways (respiratory system). Coughing helps to heal and protect your lungs. It is normal to cough occasionally, but a cough that happens with other symptoms or lasts a long time may be a sign of a condition that needs treatment. An acute cough may only last 2-3 weeks, while a chronic cough may last 8 or more weeks. Coughing is commonly caused by:  Infection of the respiratory systemby viruses or bacteria.  Breathing in substances that irritate your lungs.  Allergies.  Asthma.  Mucus that runs down the back of your throat (postnasal drip).  Smoking.  Acid backing up from the stomach into the esophagus (gastroesophageal reflux).  Certain medicines.  Chronic lung problems.  Other medical conditions such as heart failure or a blood clot in the lung (pulmonary embolism). Follow these instructions at home: Medicines  Take over-the-counter and prescription medicines only as told by your health care provider.  Talk with your health care provider before you take a cough suppressant medicine. Lifestyle   Avoid cigarette smoke. Do  not use any products that contain nicotine or tobacco, such as cigarettes, e-cigarettes, and chewing tobacco. If you need help quitting, ask your health care provider.  Drink enough fluid to keep your urine pale yellow.  Avoid caffeine.  Do not drink alcohol if your health care provider tells you not to drink. General instructions   Pay close attention to changes in your cough. Tell your health care provider about them.  Always cover your mouth when you  cough.  Avoid things that make you cough, such as perfume, candles, cleaning products, or campfire or tobacco smoke.  If the air is dry, use a cool mist vaporizer or humidifier in your bedroom or your home to help loosen secretions.  If your cough is worse at night, try to sleep in a semi-upright position.  Rest as needed.  Keep all follow-up visits as told by your health care provider. This is important. Contact a health care provider if you:  Have new symptoms.  Cough up pus.  Have a cough that does not get better after 2-3 weeks or gets worse.  Cannot control your cough with cough suppressant medicines and you are losing sleep.  Have pain that gets worse or pain that is not helped with medicine.  Have a fever.  Have unexplained weight loss.  Have night sweats. Get help right away if:  You cough up blood.  You have difficulty breathing.  Your heartbeat is very fast. These symptoms may represent a serious problem that is an emergency. Do not wait to see if the symptoms will go away. Get medical help right away. Call your local emergency services (911 in the [place] Do not drive yourself to the hospital. Summary  Coughing is a reflex that clears your throat and your airways. It is normal to cough occasionally, but a cough that happens with other symptoms or lasts a long time may be a sign of a condition that needs treatment.  Take over-the-counter and prescription medicines only as told by your health care  provider.  Always cover your mouth when you cough.  Contact a health care provider if you have new symptoms or a cough that does not get better after 2-3 weeks or gets worse. This information is not intended to replace advice given to you by your health care provider. Make sure you discuss any questions you have with your health care provider. Document Revised: 07/27/2018 Document Reviewed: 07/27/2018 Elsevier Patient Education  2020 [employer].

## 2020-03-25 LAB — NOVEL CORONAVIRUS, NAA: SARS-CoV-2, NAA: NOT DETECTED

## 2020-03-26 ENCOUNTER — Encounter: Payer: Self-pay | Admitting: Registered Nurse

## 2020-03-26 NOTE — Progress Notes (Signed)
Patient with diarrhea Friday given 24 hour work excuse rapid and PCR covid test negative.  Telephone message left for patient today to call clinic or reply to my chart if still having diarrhea or new fever/chills/vomiting for extended work release/re-evaluation.  Encouraged patient to hydrate with noncaffeinated beverages.  My chart message sent to patient

## 2020-04-04 DIAGNOSIS — J3089 Other allergic rhinitis: Secondary | ICD-10-CM

## 2020-04-05 MED ORDER — MONTELUKAST SODIUM 10 MG PO TABS: 10.0000 mg | ORAL_TABLET | Freq: Every day | ORAL | 1 refills | Status: DC

## 2020-04-20 ENCOUNTER — Encounter: Payer: Self-pay | Admitting: Primary Care

## 2020-04-20 ENCOUNTER — Other Ambulatory Visit: Payer: Self-pay

## 2020-04-20 ENCOUNTER — Ambulatory Visit (INDEPENDENT_AMBULATORY_CARE_PROVIDER_SITE_OTHER): Payer: BC Managed Care – PPO | Admitting: Primary Care

## 2020-04-20 VITALS — BP 110/62 | HR 75 | Temp 98.6°F | Ht 70.0 in | Wt 245.0 lb

## 2020-04-20 DIAGNOSIS — M25521 Pain in right elbow: Secondary | ICD-10-CM | POA: Diagnosis not present

## 2020-04-20 DIAGNOSIS — M7712 Lateral epicondylitis, left elbow: Secondary | ICD-10-CM | POA: Insufficient documentation

## 2020-04-20 DIAGNOSIS — Z23 Encounter for immunization: Secondary | ICD-10-CM

## 2020-04-20 MED ORDER — NAPROXEN 500 MG PO TABS: 500.0000 mg | ORAL_TABLET | Freq: Two times a day (BID) | ORAL | 0 refills | Status: DC

## 2020-04-20 NOTE — Patient Instructions (Addendum)
Start naproxen 500 mg tablets for pain and inflammation. Take this twice daily with food for at least 10 days.   Schedule an appointment with Dr. Patsy Lager if no improvement.   It was a pleasure to see you today!   Influenza (Flu) Vaccine (Inactivated or Recombinant): What You Need to Know 1. Why get vaccinated? Influenza vaccine can prevent influenza (flu). Flu is a contagious disease that spreads around the [place] every year, usually between October and May. Anyone can get the flu, but it is more dangerous for some people. Infants and young children, people 54 years of age and older, pregnant women, and people with certain health conditions or a weakened immune system are at greatest risk of flu complications. Pneumonia, bronchitis, sinus infections and ear infections are examples of flu-related complications. If you have a medical condition, such as heart disease, cancer or diabetes, flu can make it worse. Flu can cause fever and chills, sore throat, muscle aches, fatigue, cough, headache, and runny or stuffy nose. Some people may have vomiting and diarrhea, though this is more common in children than adults. Each year thousands of people in the [place] die from flu, and many more are hospitalized. Flu vaccine prevents millions of illnesses and flu-related visits to the doctor each year. 2. Influenza vaccine CDC recommends everyone 76 months of age and older get vaccinated every flu season. Children 6 months through 71 years of age may need 2 doses during a single flu season. Everyone else needs only 1 dose each flu season. It takes about 2 weeks for protection to develop after vaccination. There are many flu viruses, and they are always changing. Each year a new flu vaccine is made to protect against three or four viruses that are likely to cause disease in the upcoming flu season. Even when the vaccine doesn't exactly match these viruses, it may still provide some  protection. Influenza vaccine does not cause flu. Influenza vaccine may be given at the same time as other vaccines. 3. Talk with your health care provider Tell your vaccine provider if the person getting the vaccine:  Has had an allergic reaction after a previous dose of influenza vaccine, or has any severe, life-threatening allergies.  Has ever had Guillain-Barr Syndrome (also called GBS). In some cases, your health care provider may decide to postpone influenza vaccination to a future visit. People with minor illnesses, such as a cold, may be vaccinated. People who are moderately or severely ill should usually wait until they recover before getting influenza vaccine. Your health care provider can give you more information. 4. Risks of a vaccine reaction  Soreness, redness, and swelling where shot is given, fever, muscle aches, and headache can happen after influenza vaccine.  There may be a very small increased risk of Guillain-Barr Syndrome (GBS) after inactivated influenza vaccine (the flu shot). Young children who get the flu shot along with pneumococcal vaccine (PCV13), and/or DTaP vaccine at the same time might be slightly more likely to have a seizure caused by fever. Tell your health care provider if a child who is getting flu vaccine has ever had a seizure. People sometimes faint after medical procedures, including vaccination. Tell your provider if you feel dizzy or have vision changes or ringing in the ears. As with any medicine, there is a very remote chance of a vaccine causing a severe allergic reaction, other serious injury, or death. 5. What if there is a serious problem? An allergic reaction could occur after the  vaccinated person leaves the clinic. If you see signs of a severe allergic reaction (hives, swelling of the face and throat, difficulty breathing, a fast heartbeat, dizziness, or weakness), call 9-1-1 and get the person to the nearest hospital. For other signs that  concern you, call your health care provider. Adverse reactions should be reported to the Vaccine Adverse Event Reporting System (VAERS). Your health care provider will usually file this report, or you can do it yourself. Visit the VAERS website at www.vaers.LAgents.no or call 7016451443.VAERS is only for reporting reactions, and VAERS staff do not give medical advice. 6. The National Vaccine Injury Compensation Program The [employer] Vaccine Injury Compensation Program (VICP) is a federal program that was created to compensate people who may have been injured by certain vaccines. Visit the VICP website at SpiritualWord.at or call 7322317718 to learn about the program and about filing a claim. There is a time limit to file a claim for compensation. 7. How can I learn more?  Ask your healthcare provider.  Call your local or state health department.  Contact the Centers for Disease Control and Prevention (CDC): ? Call 380 609 0543 (1-800-CDC-INFO) or ? Visit CDC's BiotechRoom.com.cy Vaccine Information Statement (Interim) Inactivated Influenza Vaccine (03/05/2018) This information is not intended to replace advice given to you by your health care provider. Make sure you discuss any questions you have with your health care provider. Document Revised: 10/27/2018 Document Reviewed: 03/09/2018 Elsevier Patient Education  2020 [employer].

## 2020-04-20 NOTE — Progress Notes (Signed)
Subjective:    Patient ID: Anthony Ochoa, male    DOB: 05-09-1986, 34 y.o.   MRN: [id number]  HPI  This visit occurred during the SARS-CoV-2 public health emergency.  Safety protocols were in place, including screening questions prior to the visit, additional usage of staff PPE, and extensive cleaning of exam room while observing appropriate contact time as indicated for disinfecting solutions.   Anthony Ochoa is a 34 year old with a history of migraines, GERD, seasonal allergies who presents today with a chief complaint of elbow pain.  His pain is located to the right elbow with shooting pain across the side to his [facility] area. Symptoms began about 6 weeks ago which is intermittent.. Over the last month his pain has become more noticeable. He mostly notices his pain when he is active, cutting the grass, building things, repetitive movements. He uses a mouse during work hours, isn't bothered by his pain during this activity.   He denies injury/trauma, repetitive movements at home at work, swelling/erythema, weakness, decrease in ROM. He is taking 600 mg of Advil, 1000 mg of Tylenol intermittently, but not daily, with temporary improvement.   Review of Systems  Constitutional: Negative for fever.  Musculoskeletal: Positive for arthralgias. Negative for joint swelling.  Skin: Negative for color change.        Past Medical History:  Diagnosis Date  . Allergy   . Chickenpox   . Croup   . GERD (gastroesophageal reflux disease)   . Migraines      Social History   Socioeconomic History  . Marital status: Married    Spouse name: Not on file  . Number of children: Not on file  . Years of education: Not on file  . Highest education level: Not on file  Occupational History  . Not on file  Tobacco Use  . Smoking status: Never Smoker  . Smokeless tobacco: Never Used  Substance and Sexual Activity  . Alcohol use: Yes  . Drug use: Never  . Sexual activity: Not on file  Other Topics  Concern  . Not on file  Social History Narrative   Married.   2 children.   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    Social Determinants of Health   Financial Resource Strain:   . Difficulty of Paying Living Expenses: Not on file  Food Insecurity:   . Worried About Programme researcher, broadcasting/film/video in the Last Year: Not on file  . Ran Out of Food in the Last Year: Not on file  Transportation Needs:   . Lack of Transportation (Medical): Not on file  . Lack of Transportation (Non-Medical): Not on file  Physical Activity:   . Days of Exercise per Week: Not on file  . Minutes of Exercise per Session: Not on file  Stress:   . Feeling of Stress : Not on file  Social Connections:   . Frequency of Communication with Friends and Family: Not on file  . Frequency of Social Gatherings with Friends and Family: Not on file  . Attends Religious Services: Not on file  . Active Member of Clubs or Organizations: Not on file  . Attends Banker Meetings: Not on file  . Marital Status: Not on file  Intimate Partner Violence:   . Fear of Current or Ex-Partner: Not on file  . Emotionally Abused: Not on file  . Physically Abused: Not on file  . Sexually Abused: Not on file    Past Surgical History:  Procedure Laterality Date  . TONSILLECTOMY  2002    Family History  Problem Relation Age of Onset  . Miscarriages / [place] Mother   . Alcohol abuse Father   . Hyperlipidemia Maternal Grandmother   . Hypertension Maternal Grandmother   . Heart disease Maternal Grandfather   . Hyperlipidemia Maternal Grandfather   . Hypertension Maternal Grandfather   . Stroke Maternal Grandfather     Allergies  Allergen Reactions  . Penicillins Rash    Current Outpatient Medications on File Prior to Visit  Medication Sig Dispense Refill  . albuterol (PROVENTIL HFA;VENTOLIN HFA) 108 (90 Base) MCG/ACT inhaler Inhale into the lungs every 6 (six) hours as needed for wheezing or shortness of breath.    .  fluticasone (FLONASE) 50 MCG/ACT nasal spray Place into both nostrils daily.    Marland Kitchen loratadine (CLARITIN) 10 MG tablet Take 10 mg by mouth daily.    . montelukast (SINGULAIR) 10 MG tablet Take 1 tablet (10 mg total) by mouth at bedtime. For allergies. 90 tablet 1  . rizatriptan (MAXALT) 10 MG tablet Take 1 tablet (10 mg total) by mouth as needed for migraine. May repeat in 2 hours if needed 10 tablet 0   No current facility-administered medications on file prior to visit.    BP 110/62   Pulse 75   Temp 98.6 F (37 C) (Temporal)   Ht 5\' 10"  (1.778 m)   Wt 245 lb (111.1 kg)   SpO2 98%   BMI 35.15 kg/m    Objective:   Physical Exam Musculoskeletal:     Right elbow: Normal range of motion. No tenderness.     Comments: Very slight swelling noted to right elbow over medial epicondyle region   Skin:    General: Skin is warm and dry.     Findings: No erythema.  Neurological:     Mental Status: He is alert.            Assessment & Plan:

## 2020-04-20 NOTE — Assessment & Plan Note (Signed)
Acute for the last 6 weeks, no resolve.  Suspect tendonitis with mild bursitis to right elbow.  No warmth, cellulitis or evidence for septic joint.  Will have him start naproxen 500 mg BID x 2 weeks with food. Also discussed rest when applicable.   Consider sports medicine evaluation if needed.

## 2020-04-28 DIAGNOSIS — J342 Deviated nasal septum: Secondary | ICD-10-CM | POA: Diagnosis not present

## 2020-04-28 DIAGNOSIS — K219 Gastro-esophageal reflux disease without esophagitis: Secondary | ICD-10-CM | POA: Diagnosis not present

## 2020-04-28 DIAGNOSIS — J301 Allergic rhinitis due to pollen: Secondary | ICD-10-CM | POA: Diagnosis not present

## 2020-06-08 ENCOUNTER — Ambulatory Visit: Payer: BC Managed Care – PPO | Admitting: Family Medicine

## 2020-06-11 NOTE — Progress Notes (Signed)
Anthony Brogden T. Florinda Taflinger, MD, CAQ Sports Medicine  [facility] [address] [place], [address]  Phone: [phone]  FAX: [phone]  Anthony Ochoa - 34 y.o. male  MRN [id number]  Date of Birth: [birth year]  Date: 06/12/2020  PCP: Doreene Nest, NP  Referral: Doreene Nest, NP  Chief Complaint  Patient presents with  . Elbow Pain    Right    This visit occurred during the SARS-CoV-2 public health emergency.  Safety protocols were in place, including screening questions prior to the visit, additional usage of staff PPE, and extensive cleaning of exam room while observing appropriate contact time as indicated for disinfecting solutions.   Subjective:   Anthony Ochoa presents with lateral elbow pain.  Length of symptoms: Aug - now Hand effected: R  Patient describes a dull ache on the medial and lateral elbow. There is some translation in the proximal forearm and in the distal upper arm. It is painful to lift with the hand facing down and to lift with the thumb in an upright position. Supination is painful. Patient points to the lateral and medial epicondyles as the point of maximal tenderness.  No trauma.   No prior fractures or operative interventions in the effective hand. Prior PT or HEP: none  Denies numbness or tingling. No significant neck or shoulder pain.  Hand of dominance: RHD   Review of Systems is noted in the HPI, as appropriate  Objective:   Blood pressure 112/62, pulse 63, temperature 98.1 F (36.7 C), temperature source Temporal, height 5\' 10"  (1.778 m), weight 254 lb 4 oz (115.3 kg), SpO2 97 %.  GEN: No acute distress; alert,appropriate. PULM: Breathing comfortably in no respiratory distress PSYCH: Normally interactive.   Elbow: R Ecchymosis or edema: neg ROM: full flexion, extension, pronation, supination Shoulder ROM: Full Flexion: 5/5 Extension: 5/5,  PAINFUL Supination: 5/5, PAINFUL Pronation: 5/5, Painful Wrist ext: 5/5, painful Wrist flexion: 5/5 No gross bony abnormality Varus and Valgus stress: stable ECRB tenderness: YES, TTP Medial epicondyle: TTP Lateral epicondyle, resisted wrist extension from wrist full pronation and flexion: PAINFUL grip: 5/5  sensation intact Tinel's, Elbow: negative  Subjective:     ICD-10-CM   1. Lateral epicondylitis of right elbow  M77.11   2. Medial epicondylitis, right elbow  M77.01    Total encounter time: 30 minutes. This includes total time spent on the day of encounter.  Elbow anatomy was reviewed, and tendinopathy was explained.  Pt. given a formal rehab program from [facility] on elbow rehabiliation.  Start off with isometrics and gentle stretching and ROM progressing to a series of concentric and eccentric exercises should be done starting with no weight, work up to 1 lb, hammer, etc.  Use counterforce strap if working or using hands.  Emphasized stretching an cross-friction massage Emphasized proper palms up lifting biomechanics to unload ECRB  Follow-up: Return in about 1 month (around 07/12/2020) for ok to cancel is you are doing a lot better.  Meds ordered this encounter  Medications  . predniSONE (DELTASONE) 20 MG tablet    Sig: 2 tabs po daily for 5 days, then 1 tab po daily for 5 days    Dispense:  15 tablet    Refill:  0   No orders of the defined types were placed in this encounter.   Signed,  07/14/2020. Glorious Flicker, MD   Patient's Medications  New Prescriptions   PREDNISONE (DELTASONE)  20 MG TABLET    2 tabs po daily for 5 days, then 1 tab po daily for 5 days  Previous Medications   ALBUTEROL (PROVENTIL HFA;VENTOLIN HFA) 108 (90 BASE) MCG/ACT INHALER    Inhale into the lungs every 6 (six) hours as needed for wheezing or shortness of breath.   FLUTICASONE (FLONASE) 50 MCG/ACT NASAL SPRAY    Place into both nostrils daily.   LORATADINE (CLARITIN) 10 MG TABLET     Take 10 mg by mouth daily.   MONTELUKAST (SINGULAIR) 10 MG TABLET    Take 1 tablet (10 mg total) by mouth at bedtime. For allergies.   RIZATRIPTAN (MAXALT) 10 MG TABLET    Take 1 tablet (10 mg total) by mouth as needed for migraine. May repeat in 2 hours if needed  Modified Medications   No medications on file  Discontinued Medications   NAPROXEN (NAPROSYN) 500 MG TABLET    Take 1 tablet (500 mg total) by mouth 2 (two) times daily with a meal. For pain.

## 2020-06-12 ENCOUNTER — Other Ambulatory Visit: Payer: Self-pay

## 2020-06-12 ENCOUNTER — Encounter: Payer: Self-pay | Admitting: Family Medicine

## 2020-06-12 ENCOUNTER — Ambulatory Visit: Payer: BC Managed Care – PPO | Admitting: Family Medicine

## 2020-06-12 VITALS — BP 112/62 | HR 63 | Temp 98.1°F | Ht 70.0 in | Wt 254.2 lb

## 2020-06-12 DIAGNOSIS — M7711 Lateral epicondylitis, right elbow: Secondary | ICD-10-CM | POA: Diagnosis not present

## 2020-06-12 DIAGNOSIS — M7701 Medial epicondylitis, right elbow: Secondary | ICD-10-CM

## 2020-06-12 MED ORDER — PREDNISONE 20 MG PO TABS: ORAL_TABLET | ORAL | 0 refills | Status: DC

## 2020-06-20 DIAGNOSIS — Z23 Encounter for immunization: Secondary | ICD-10-CM | POA: Diagnosis not present

## 2020-07-12 ENCOUNTER — Ambulatory Visit: Payer: BC Managed Care – PPO | Admitting: Family Medicine

## 2020-07-27 ENCOUNTER — Other Ambulatory Visit: Payer: Self-pay

## 2020-07-27 ENCOUNTER — Other Ambulatory Visit: Payer: Self-pay | Admitting: Family Medicine

## 2020-07-27 ENCOUNTER — Telehealth (INDEPENDENT_AMBULATORY_CARE_PROVIDER_SITE_OTHER): Payer: BC Managed Care – PPO | Admitting: Family Medicine

## 2020-07-27 VITALS — HR 81 | Wt 245.0 lb

## 2020-07-27 DIAGNOSIS — Z20822 Contact with and (suspected) exposure to covid-19: Secondary | ICD-10-CM | POA: Diagnosis not present

## 2020-07-27 NOTE — Progress Notes (Signed)
    I connected with [name] on 07/27/20 at  4:20 PM EST by video and verified that I am speaking with the correct person using two identifiers.   I discussed the limitations, risks, security and privacy concerns of performing an evaluation and management service by video and the availability of in person appointments. I also discussed with the patient that there may be a patient responsible charge related to this service. The patient expressed understanding and agreed to proceed.  Patient location: work Provider Location: [facility] Participants: Lynnda Child and [name]   Subjective:     Anthony Ochoa is a 35 y.o. male presenting for Sore Throat, Sinusitis, and Ear Pain     Sore Throat  The current episode started yesterday. The problem has been gradually worsening. Associated symptoms include congestion, ear pain (started 4 hours ago) and headaches. Pertinent negatives include no coughing, diarrhea, shortness of breath or vomiting.  Sinusitis This is a new problem. The current episode started yesterday. There has been no fever. Associated symptoms include congestion, ear pain (started 4 hours ago), headaches, sinus pressure and a sore throat. Pertinent negatives include no chills, coughing, shortness of breath or sneezing. Past treatments include oral decongestants. The treatment provided moderate relief.   Sick contact: no  No loss of taste or smell  Recent booster to covid Work has a daily test - rapid which was negative today a 1 pm   Review of Systems  Constitutional: Negative for chills.  HENT: Positive for congestion, ear pain (started 4 hours ago), sinus pressure and sore throat. Negative for sneezing.   Respiratory: Negative for cough and shortness of breath.   Gastrointestinal: Negative for diarrhea and vomiting.  Neurological: Positive for headaches.     Social History   Tobacco Use  Smoking Status Never Smoker  Smokeless Tobacco  Never Used        Objective:   BP Readings from Last 3 Encounters:  06/12/20 112/62  04/20/20 110/62  01/17/20 112/70   Wt Readings from Last 3 Encounters:  07/27/20 245 lb (111.1 kg)  06/12/20 254 lb 4 oz (115.3 kg)  04/20/20 245 lb (111.1 kg)    Pulse 81   Wt 245 lb (111.1 kg)   BMI 35.15 kg/m    Speaking clearly No acute distress Mood good        Assessment & Plan:   Problem List Items Addressed This Visit   None   Visit Diagnoses    Suspected COVID-19 virus infection    -  Primary     Discussed OTC treatment for viral illness Patient will come today for drive-up testing Instructed to isolate until the results come back  If negative and symptoms persisting will call back for consideration for course of antibiotics    Interactive audio and video telecommunications were attempted between this provider and patient, however failed, due to patient having technical difficulties OR patient did not have access to video capability.  We continued and completed visit with audio only.   Start Time: 4:34 End Time: 4:44    No follow-ups on file.  Lynnda Child, MD

## 2020-07-28 ENCOUNTER — Telehealth: Payer: BC Managed Care – PPO | Admitting: Nurse Practitioner

## 2020-07-28 ENCOUNTER — Other Ambulatory Visit: Payer: Self-pay

## 2020-07-28 ENCOUNTER — Other Ambulatory Visit: Payer: BC Managed Care – PPO

## 2020-07-28 ENCOUNTER — Ambulatory Visit: Payer: BC Managed Care – PPO

## 2020-07-28 ENCOUNTER — Encounter: Payer: Self-pay | Admitting: Family Medicine

## 2020-07-28 DIAGNOSIS — Z20822 Contact with and (suspected) exposure to covid-19: Secondary | ICD-10-CM

## 2020-07-28 DIAGNOSIS — J029 Acute pharyngitis, unspecified: Secondary | ICD-10-CM

## 2020-07-28 LAB — POC COVID19 BINAXNOW: SARS Coronavirus 2 Ag: NEGATIVE

## 2020-07-28 MED ORDER — AZITHROMYCIN 250 MG PO TABS: ORAL_TABLET | ORAL | 0 refills | Status: DC

## 2020-07-28 NOTE — Progress Notes (Signed)
   Subjective:    Patient ID: Anthony Ochoa, male    DOB: 1986/05/09, 35 y.o.   MRN: [id number]  HPI 35 year old male presenting to clinic for virtual appointment today   Annually he suffers from sinus congestion when weather changes Sinus HA yesterday.   COVID test on campus yesterday negative  More congestion yesterday  Using netty pot and gargling salt water   Feels his uvula is enlarged red and painful, he does not have tonsils  Vaccinated for COVID with booster 06/20/20  No known sick contacts   Minimal risk for exposure   No fever   Symptoms 2 days  On claritin and flonase  Afrin as needed   Multi vitamin   Review of Systems  Constitutional: Negative.   HENT: Positive for congestion, sinus pressure, sinus pain and sore throat.   Respiratory: Negative.   Genitourinary: Negative.   Neurological: Negative.    Past Medical History:  Diagnosis Date  . Allergy   . Chickenpox   . Croup   . GERD (gastroesophageal reflux disease)   . Migraines       Objective:   Physical Exam   This was a telehealth appointment with patient he will come by clinic for PCR testing in person  No acute distress during phone conversation with patient      Assessment & Plan:  Due to uvular pain and enlargement will order Zpack if no improvement or with worsening symptoms in the next 48 hours.   HE will otherwise continue OTC management of congestion with medications as discussed and use allergy regimen consistently.   Will use Zpack for throat pain, hold off if that improves and will plan to have follow up visit in office for persistent congestion  Will send PCR to confirm origin of symptoms is not related to COVID-19  Symptom onset 07/27/20 return to work.leav isolation 08/01/20 if PCR returns positive   Meds ordered this encounter  Medications  . azithromycin (ZITHROMAX) 250 MG tablet    Sig: Take 2 tablets on day one, one tablet daily on days 2-5. Take with food     Dispense:  6 tablet    Refill:  0

## 2020-07-31 ENCOUNTER — Ambulatory Visit: Payer: BC Managed Care – PPO | Admitting: Primary Care

## 2020-07-31 LAB — NOVEL CORONAVIRUS, NAA: SARS-CoV-2, NAA: NOT DETECTED

## 2020-07-31 LAB — SARS-COV-2, NAA 2 DAY TAT

## 2020-07-31 NOTE — Progress Notes (Signed)
yes

## 2020-07-31 NOTE — Progress Notes (Signed)
Called by RN Asher Muir result given to patient.

## 2020-07-31 NOTE — Progress Notes (Signed)
Called and informed patient of negative PCR result.

## 2020-08-03 ENCOUNTER — Other Ambulatory Visit: Payer: Self-pay

## 2020-08-03 ENCOUNTER — Telehealth: Payer: BC Managed Care – PPO | Admitting: Nurse Practitioner

## 2020-08-03 ENCOUNTER — Ambulatory Visit: Payer: BC Managed Care – PPO

## 2020-08-03 DIAGNOSIS — Z20822 Contact with and (suspected) exposure to covid-19: Secondary | ICD-10-CM

## 2020-08-03 LAB — POC COVID19 BINAXNOW: SARS Coronavirus 2 Ag: NEGATIVE

## 2020-08-03 NOTE — Progress Notes (Signed)
   Subjective:    Patient ID: Anthony Ochoa, male    DOB: May 06, 1986, 35 y.o.   MRN: [id number]  HPI  74 year old daughter was positive 08/01/20 with a home test. She was symptomatic  Patient had negative last week when he was symptomatic at that time negative rapid and PCR.  He has been vaccinated  Here for testing today   Review of Systems     Objective:   Physical Exam  This was a telehealth appointment with patient after nurse visit for COVID testing, patient is in no acute distress  Recent Results (from the past 2160 hour(s))  Novel Coronavirus, NAA ([facility])     Status: None   Collection Time: 07/28/20  9:19 AM   Specimen: Nasopharyngeal(NP) swabs in vial transport medium   Nasopharynge  Result Value Ref Range   SARS-CoV-2, NAA Not Detected Not Detected    Comment: This nucleic acid amplification test was developed and its performance characteristics determined by [employer]. Nucleic acid amplification tests include RT-PCR and TMA. This test has not been FDA cleared or approved. This test has been authorized by FDA under an Emergency Use Authorization (EUA). This test is only authorized for the duration of time the declaration that circumstances exist justifying the authorization of the emergency use of in vitro diagnostic tests for detection of SARS-CoV-2 virus and/or diagnosis of COVID-19 infection under section 564(b)(1) of the Act, 21 U.S.C. 335KTG-2(B) (1), unless the authorization is terminated or revoked sooner. When diagnostic testing is negative, the possibility of a false negative result should be considered in the context of a patient's recent exposures and the presence of clinical signs and symptoms consistent with COVID-19. An individual without symptoms of COVID-19 and who is not shedding SARS-CoV-2 virus wo uld expect to have a negative (not detected) result in this assay.   SARS-COV-2, NAA 2 DAY TAT     Status: None   Collection Time:  07/28/20  9:19 AM   Nasopharynge  Result Value Ref Range   SARS-CoV-2, NAA 2 DAY TAT Performed   POC COVID-19     Status: Normal   Collection Time: 07/28/20 10:12 AM  Result Value Ref Range   SARS Coronavirus 2 Ag Negative Negative    Comment: Vaccinated, boosted patient with mild symptoms aware of neg POC result. has pcr pending and has had telephone appt with Lavone Neri FNP  POC COVID-19     Status: Normal   Collection Time: 08/03/20 11:23 AM  Result Value Ref Range   SARS Coronavirus 2 Ag Negative Negative    Comment: Vaccinated and boosted, mildly symptomatic patient aware of negative POC results. Has telephone appt with Lavone Neri FNP for further evaluation.      Assessment & Plan:  Advised patient if PCR is negative AND daughter has a confirmation positive will consider retesting next week  If daughter has a negative PCR and he remains without symptoms no further testing indicated

## 2020-08-06 ENCOUNTER — Encounter: Payer: Self-pay | Admitting: Nurse Practitioner

## 2020-08-06 LAB — SARS-COV-2, NAA 2 DAY TAT

## 2020-08-06 LAB — NOVEL CORONAVIRUS, NAA: SARS-CoV-2, NAA: NOT DETECTED

## 2020-09-18 ENCOUNTER — Other Ambulatory Visit: Payer: Self-pay

## 2020-09-18 ENCOUNTER — Ambulatory Visit: Payer: BC Managed Care – PPO

## 2020-09-18 ENCOUNTER — Telehealth: Payer: BC Managed Care – PPO | Admitting: Medical

## 2020-09-18 DIAGNOSIS — R059 Cough, unspecified: Secondary | ICD-10-CM

## 2020-09-18 DIAGNOSIS — R6889 Other general symptoms and signs: Secondary | ICD-10-CM

## 2020-09-18 DIAGNOSIS — R519 Headache, unspecified: Secondary | ICD-10-CM

## 2020-09-18 DIAGNOSIS — J3489 Other specified disorders of nose and nasal sinuses: Secondary | ICD-10-CM

## 2020-09-18 DIAGNOSIS — J029 Acute pharyngitis, unspecified: Secondary | ICD-10-CM

## 2020-09-18 DIAGNOSIS — Z20822 Contact with and (suspected) exposure to covid-19: Secondary | ICD-10-CM

## 2020-09-18 LAB — POCT INFLUENZA A/B
Influenza A, POC: NEGATIVE
Influenza B, POC: NEGATIVE

## 2020-09-18 LAB — POC COVID19 BINAXNOW: SARS Coronavirus 2 Ag: NEGATIVE

## 2020-09-18 MED ORDER — AZITHROMYCIN 250 MG PO TABS: ORAL_TABLET | ORAL | 0 refills | Status: DC

## 2020-09-18 NOTE — Patient Instructions (Signed)

## 2020-09-18 NOTE — Progress Notes (Signed)
Subjective:    Patient ID: Anthony Ochoa, male    DOB: [birth year], 35 y.o.   MRN: [id number]  HPI 35 yo male in non acute distress consents to telemedicine appointment. Symptoms of ST, runny nose yellow discharge cough w/ yellow discharge, fatigue and general malaise,  Waves of nausea and HA started yesterday.  His children were sick last week with similar symptoms, they were tested for Covid-19 and were negative and also tested for Influenza which also was negative. They were sick 24-48 hours with fever and then there symptoms resolved.  O2sat 95%   Allergies  Allergen Reactions  . Penicillins Rash     Current Outpatient Medications:  .  azithromycin (ZITHROMAX) 250 MG tablet, Take 2 tablets today by mouth then one tablet days 2-5, take with food., Disp: 6 tablet, Rfl: 0 .  albuterol (PROVENTIL HFA;VENTOLIN HFA) 108 (90 Base) MCG/ACT inhaler, Inhale into the lungs every 6 (six) hours as needed for wheezing or shortness of breath., Disp: , Rfl:  .  fluticasone (FLONASE) 50 MCG/ACT nasal spray, Place into both nostrils daily., Disp: , Rfl:  .  loratadine (CLARITIN) 10 MG tablet, Take 10 mg by mouth daily., Disp: , Rfl:  .  montelukast (SINGULAIR) 10 MG tablet, Take 1 tablet (10 mg total) by mouth at bedtime. For allergies., Disp: 90 tablet, Rfl: 1 .  Multiple Vitamins-Minerals (MULTIVITAMIN MEN PO), Take by mouth daily., Disp: , Rfl:  .  rizatriptan (MAXALT) 10 MG tablet, Take 1 tablet (10 mg total) by mouth as needed for migraine. May repeat in 2 hours if needed, Disp: 10 tablet, Rfl: 0 Review of Systems  Constitutional: Positive for appetite change (decreased appetite, can still taste food.), chills and fatigue. Negative for fever.  HENT: Positive for congestion, ear pain (on and off left), postnasal drip, rhinorrhea, sinus pressure (under the eyes and forehead), sneezing and sore throat. Negative for sinus pain.   Respiratory: Positive for cough (yellow mucus) and wheezing. Negative  for chest tightness and shortness of breath.   Cardiovascular: Negative for chest pain.  Gastrointestinal: Positive for nausea. Negative for abdominal pain, diarrhea and vomiting.  Genitourinary: Negative for difficulty urinating.  Musculoskeletal: Negative for myalgias.  Skin: Negative for color change and rash.  Allergic/Immunologic: Positive for environmental allergies.  Neurological: Positive for headaches (on/off). Negative for dizziness, syncope and light-headedness.       Objective:   Physical Exam AXOX3 Sounds nasal congested, no SOB noted or distrerss No physical exam performed due to telemedicine appointment.  Results for orders placed or performed in visit on 09/18/20 (from the past 24 hour(s))  POC COVID-19     Status: Normal   Collection Time: 09/18/20  9:14 AM  Result Value Ref Range   SARS Coronavirus 2 Ag Negative Negative  POCT Influenza A/B     Status: Normal   Collection Time: 09/18/20  9:16 AM  Result Value Ref Range   Influenza A, POC Negative Negative   Influenza B, POC Negative Negative       Assessment & Plan:  ST , cough, runny nose, headache Hx of seasonal allergies. Restart Flonase continue Claritin and Sinulair. Ordered PCR Covid-19 test. Meds ordered this encounter  Medications  . azithromycin (ZITHROMAX) 250 MG tablet    Sig: Take 2 tablets today by mouth then one tablet days 2-5, take with food.    Dispense:  6 tablet    Refill:  0  To hold off 8 hours to see if yellow/clear discharge  gets clearer If worsening to take Z-pak. Isolate, rest , incsrease fluids, take OTC Motrin or Tylenol for pain or fever. Patient to stay home until I discuss PCR results with him. Follow up with clinic if not improving in 3-5 days. Patient verbalizes understanding and has no questions at the end of our conversation.

## 2020-09-19 LAB — SARS-COV-2, NAA 2 DAY TAT

## 2020-09-19 LAB — NOVEL CORONAVIRUS, NAA: SARS-CoV-2, NAA: NOT DETECTED

## 2020-09-20 ENCOUNTER — Encounter: Payer: Self-pay | Admitting: Medical

## 2020-10-09 ENCOUNTER — Ambulatory Visit: Payer: BC Managed Care – PPO

## 2020-10-09 ENCOUNTER — Telehealth: Payer: BC Managed Care – PPO | Admitting: Medical

## 2020-10-16 ENCOUNTER — Telehealth: Payer: BC Managed Care – PPO | Admitting: Medical

## 2020-10-16 ENCOUNTER — Ambulatory Visit: Payer: BC Managed Care – PPO

## 2020-10-19 ENCOUNTER — Ambulatory Visit: Payer: BC Managed Care – PPO | Admitting: Nurse Practitioner

## 2020-10-19 ENCOUNTER — Encounter: Payer: Self-pay | Admitting: Nurse Practitioner

## 2020-10-19 ENCOUNTER — Other Ambulatory Visit: Payer: Self-pay

## 2020-10-19 ENCOUNTER — Ambulatory Visit: Payer: BC Managed Care – PPO

## 2020-10-19 ENCOUNTER — Encounter: Payer: Self-pay | Admitting: Medical

## 2020-10-19 VITALS — BP 106/78 | HR 86 | Temp 98.4°F | Resp 16

## 2020-10-19 DIAGNOSIS — Z20822 Contact with and (suspected) exposure to covid-19: Secondary | ICD-10-CM

## 2020-10-19 DIAGNOSIS — J302 Other seasonal allergic rhinitis: Secondary | ICD-10-CM

## 2020-10-19 LAB — POC COVID19 BINAXNOW: SARS Coronavirus 2 Ag: NEGATIVE

## 2020-10-19 NOTE — Progress Notes (Signed)
poc

## 2020-10-19 NOTE — Progress Notes (Signed)
Subjective:     Patient ID: Anthony Ochoa, male   DOB: Sep 14, 1985, 35 y.o.   MRN: [id number]  HPI  35 year old male presenting to [facility] with recurrent sinus symptoms. Symptoms started yesterday sore throat nasal congestion. He was treated for a sinus infection in both January and late February of this year.   Patient moved to [facility] in 2015- has had allergies since moving to the state. He has been to an allergist for testing, has seen ENT. Offered immunotherapy with 50% chance of improvement that was just prior to pandemic and he has not returned to the allergist since that time.   He uses Zyrtec and flonase daily. Will add sudafed and afrin temporarily when symptoms worsen. He waked up congested most mornings. Sleeps with humidifier and takes a hot shower for relief in the am.   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  Today he has had 4 advil today and sudafed and zyrtec   Feels that symptoms settle down for a little bit while on antibiotic and then return. He had a Zpack in January and February.   Denies cough or any asthmatic history or symptoms. He has also tried Singulair in the past without any improvement.   Today denies sinus headaches or fevers.   - had tonsils and adenoids removed as a child.  + for snoring   Feels fatigued more so when the congestion is worse.   Today's Vitals   10/19/20 1047  BP: 106/78  Pulse: 86  Resp: 16  Temp: 98.4 F (36.9 C)  TempSrc: Tympanic  SpO2: 96%   There is no height or weight on file to calculate BMI.   Review of Systems  Constitutional: Positive for fatigue.  HENT: Positive for congestion, postnasal drip and sore throat.   Eyes: Negative.   Respiratory: Negative.   Genitourinary: Negative.   Musculoskeletal: Negative.   Allergic/Immunologic: Positive for environmental allergies.       Objective:   Physical Exam HENT:     Head: Normocephalic.     Right Ear:  Tympanic membrane and ear canal normal.     Left Ear: Tympanic membrane and ear canal normal.     Nose: Congestion and rhinorrhea present.     Mouth/Throat:     Mouth: Mucous membranes are pale.     Pharynx: No pharyngeal swelling, oropharyngeal exudate or posterior oropharyngeal erythema.  Pulmonary:     Effort: Pulmonary effort is normal.     Breath sounds: Normal breath sounds.  Skin:    General: Skin is warm and dry.  Neurological:     General: No focal deficit present.     Mental Status: He is alert.  Psychiatric:        Mood and Affect: Mood normal.    Recent Results (from the past 2160 hour(s))  Novel Coronavirus, NAA ([facility])     Status: None   Collection Time: 07/28/20  9:19 AM   Specimen: Nasopharyngeal(NP) swabs in vial transport medium   Nasopharynge  Result Value Ref Range   SARS-CoV-2, NAA Not Detected Not Detected    Comment: This nucleic acid amplification test was developed and its performance characteristics determined by [employer]. Nucleic acid amplification tests include RT-PCR and TMA. This test has not been FDA cleared or approved. This test has been authorized by FDA under an Emergency Use Authorization (EUA). This test is only authorized for the duration of time the  declaration that circumstances exist justifying the authorization of the emergency use of in vitro diagnostic tests for detection of SARS-CoV-2 virus and/or diagnosis of COVID-19 infection under section 564(b)(1) of the Act, 21 U.S.C. 998PJA-2(N) (1), unless the authorization is terminated or revoked sooner. When diagnostic testing is negative, the possibility of a false negative result should be considered in the context of a patient's recent exposures and the presence of clinical signs and symptoms consistent with COVID-19. An individual without symptoms of COVID-19 and who is not shedding SARS-CoV-2 virus wo uld expect to have a negative (not detected) result in this assay.    SARS-COV-2, NAA 2 DAY TAT     Status: None   Collection Time: 07/28/20  9:19 AM   Nasopharynge  Result Value Ref Range   SARS-CoV-2, NAA 2 DAY TAT Performed   POC COVID-19     Status: Normal   Collection Time: 07/28/20 10:12 AM  Result Value Ref Range   SARS Coronavirus 2 Ag Negative Negative    Comment: Vaccinated, boosted patient with mild symptoms aware of neg POC result. has pcr pending and has had telephone appt with Lavone Neri FNP  POC COVID-19     Status: Normal   Collection Time: 08/03/20 11:23 AM  Result Value Ref Range   SARS Coronavirus 2 Ag Negative Negative    Comment: Vaccinated and boosted, mildly symptomatic patient aware of negative POC results. Has telephone appt with Lavone Neri FNP for further evaluation.  Novel Coronavirus, NAA ([facility])     Status: None   Collection Time: 08/03/20 11:42 AM   Specimen: Nasopharyngeal(NP) swabs in vial transport medium   Nasopharynge  Is this  Result Value Ref Range   SARS-CoV-2, NAA Not Detected Not Detected    Comment: This nucleic acid amplification test was developed and its performance characteristics determined by [employer] [facility]. Nucleic acid amplification tests include RT-PCR and TMA. This test has not been FDA cleared or approved. This test has been authorized by FDA under an Emergency Use Authorization (EUA). This test is only authorized for the duration of time the declaration that circumstances exist justifying the authorization of the emergency use of in vitro diagnostic tests for detection of SARS-CoV-2 virus and/or diagnosis of COVID-19 infection under section 564(b)(1) of the Act, 21 U.S.C. 053ZJQ-7(H) (1), unless the authorization is terminated or revoked sooner. When diagnostic testing is negative, the possibility of a false negative result should be considered in the context of a patient's recent exposures and the presence of clinical signs and symptoms consistent with COVID-19. An individual without  symptoms of COVID-19 and who is not shedding SARS-CoV-2 virus wo uld expect to have a negative (not detected) result in this assay.   SARS-COV-2, NAA 2 DAY TAT     Status: None   Collection Time: 08/03/20 11:42 AM   Nasopharynge  Is this  Result Value Ref Range   SARS-CoV-2, NAA 2 DAY TAT Performed   Novel Coronavirus, NAA ([facility])     Status: None   Collection Time: 09/18/20  9:00 AM   Specimen: Nasopharyngeal(NP) swabs in vial transport medium   Nasopharynge  Result Value Ref Range   SARS-CoV-2, NAA Not Detected Not Detected    Comment: This nucleic acid amplification test was developed and its performance characteristics determined by [facility] [employer] [facility]. Nucleic acid amplification tests include RT-PCR and TMA. This test has not been FDA cleared or approved. This test has been authorized by FDA under an Emergency Use Authorization (EUA). This test is  only authorized for the duration of time the declaration that circumstances exist justifying the authorization of the emergency use of in vitro diagnostic tests for detection of SARS-CoV-2 virus and/or diagnosis of COVID-19 infection under section 564(b)(1) of the Act, 21 U.S.C. 700FVC-9(S) (1), unless the authorization is terminated or revoked sooner. When diagnostic testing is negative, the possibility of a false negative result should be considered in the context of a patient's recent exposures and the presence of clinical signs and symptoms consistent with COVID-19. An individual without symptoms of COVID-19 and who is not shedding SARS-CoV-2 virus wo uld expect to have a negative (not detected) result in this assay.   SARS-COV-2, NAA 2 DAY TAT     Status: None   Collection Time: 09/18/20  9:00 AM   Nasopharynge  Result Value Ref Range   SARS-CoV-2, NAA 2 DAY TAT Performed   POC COVID-19     Status: Normal   Collection Time: 09/18/20  9:14 AM  Result Value Ref Range   SARS Coronavirus 2 Ag Negative Negative    Comment:  Patient vaccinated. Patient aware of negative result. Virtual f/u with H. Ratcliffe  POCT Influenza A/B     Status: Normal   Collection Time: 09/18/20  9:16 AM  Result Value Ref Range   Influenza A, POC Negative Negative   Influenza B, POC Negative Negative  POC COVID-19     Status: Normal   Collection Time: 10/19/20 10:45 AM  Result Value Ref Range   SARS Coronavirus 2 Ag Negative Negative    Comment: Symptomatic patient aware of negative POC results. Has appt with Lavone Neri FNP for further eva.      Assessment:     Uncontrolled allergies with exacerbation  Risk for Sleep apnea      Plan:     Recommended continuing allergy plan, patient is very compliant- may use sudafed and afrin as needed in short bursts (2-3 days).   Advised discussion with PCP regarding sleep study to r/o sleep apnea that may be underlying and worsening exacerbations and fatigue during allergy seasons.   Patient may try breath right strips for temporary relief.   Advised to stay hydrated to maintain mucous production.   RTC if symptoms persist or with new concerns as discussed.

## 2020-11-06 DIAGNOSIS — H43812 Vitreous degeneration, left eye: Secondary | ICD-10-CM | POA: Diagnosis not present

## 2020-11-21 MED ORDER — FLUTICASONE PROPIONATE 50 MCG/ACT NA SUSP: 2.0000 | Freq: Every day | NASAL | 1 refills | Status: DC

## 2021-01-31 IMAGING — DX DG HAND COMPLETE 3+V*R*
3 series · 3 of 3 positions shown · non-contrast
Comparison: None.

CLINICAL DATA: Right hand pain.

EXAM:
RIGHT HAND - COMPLETE 3+ VIEW

[hand ap]
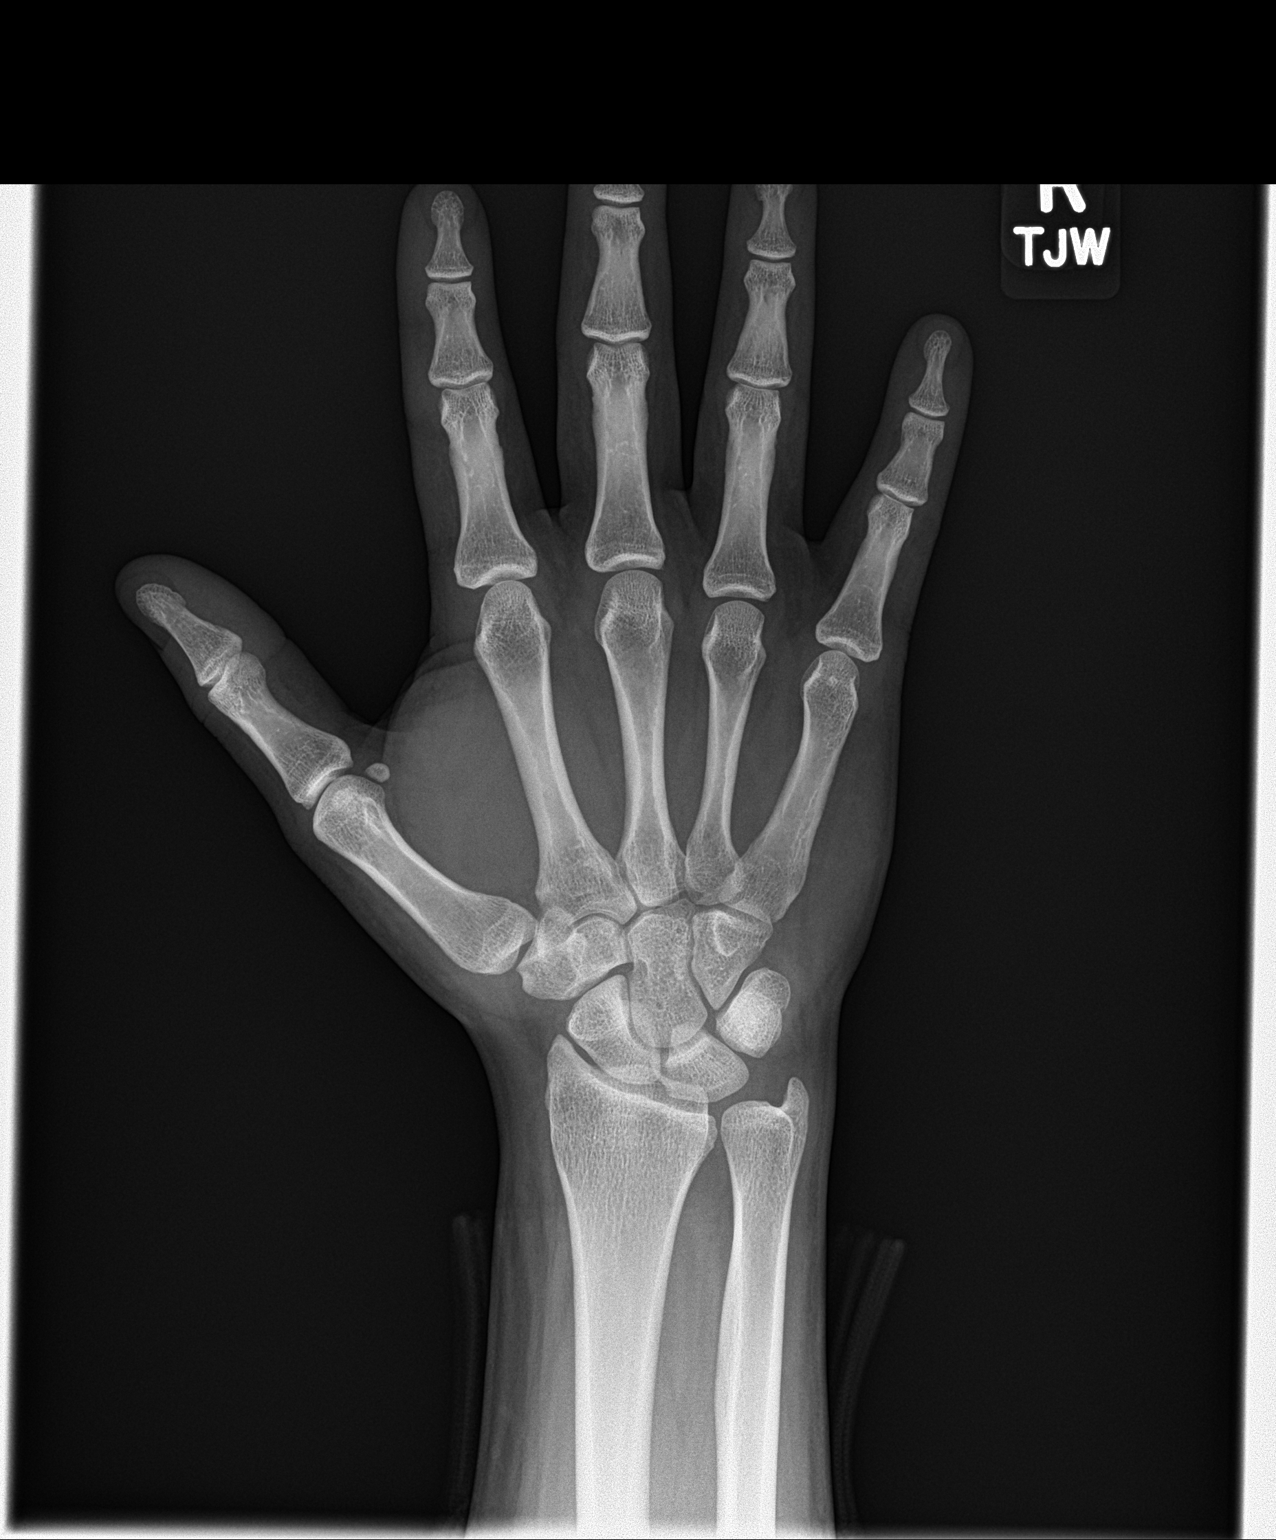

[hand obl]
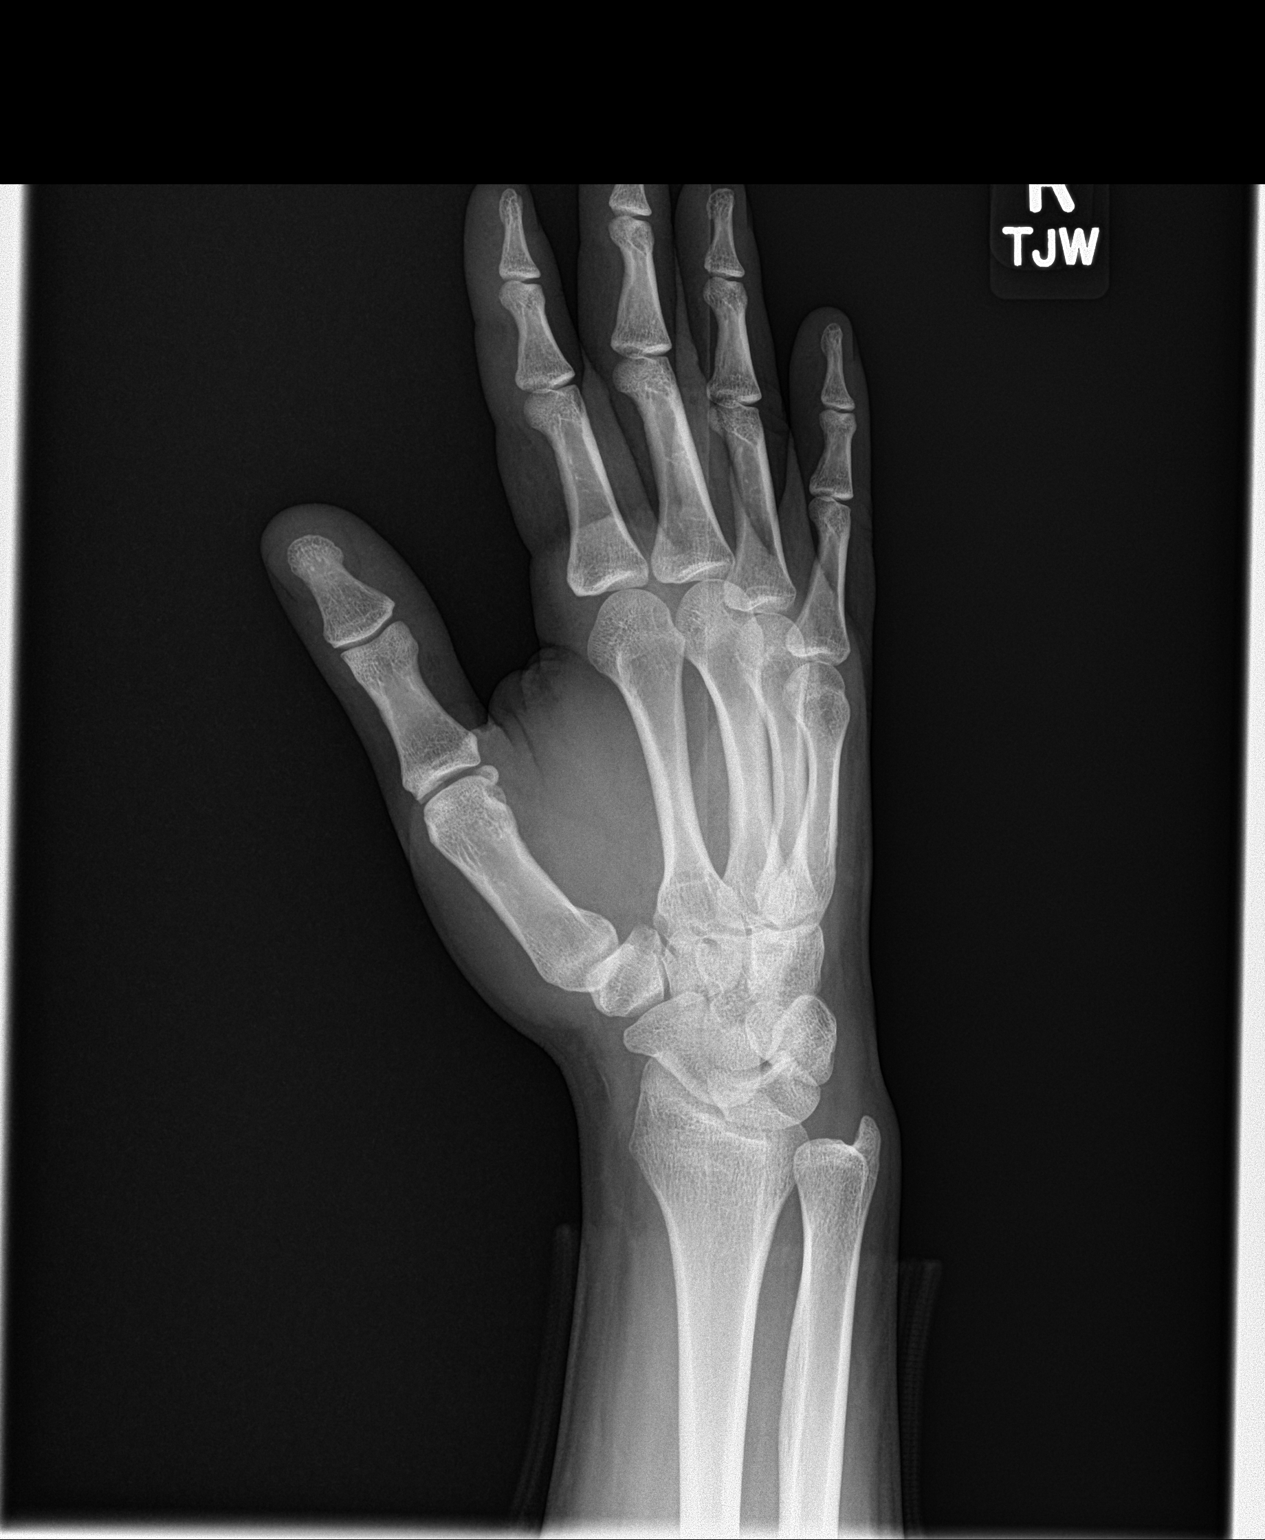

[hand lat]
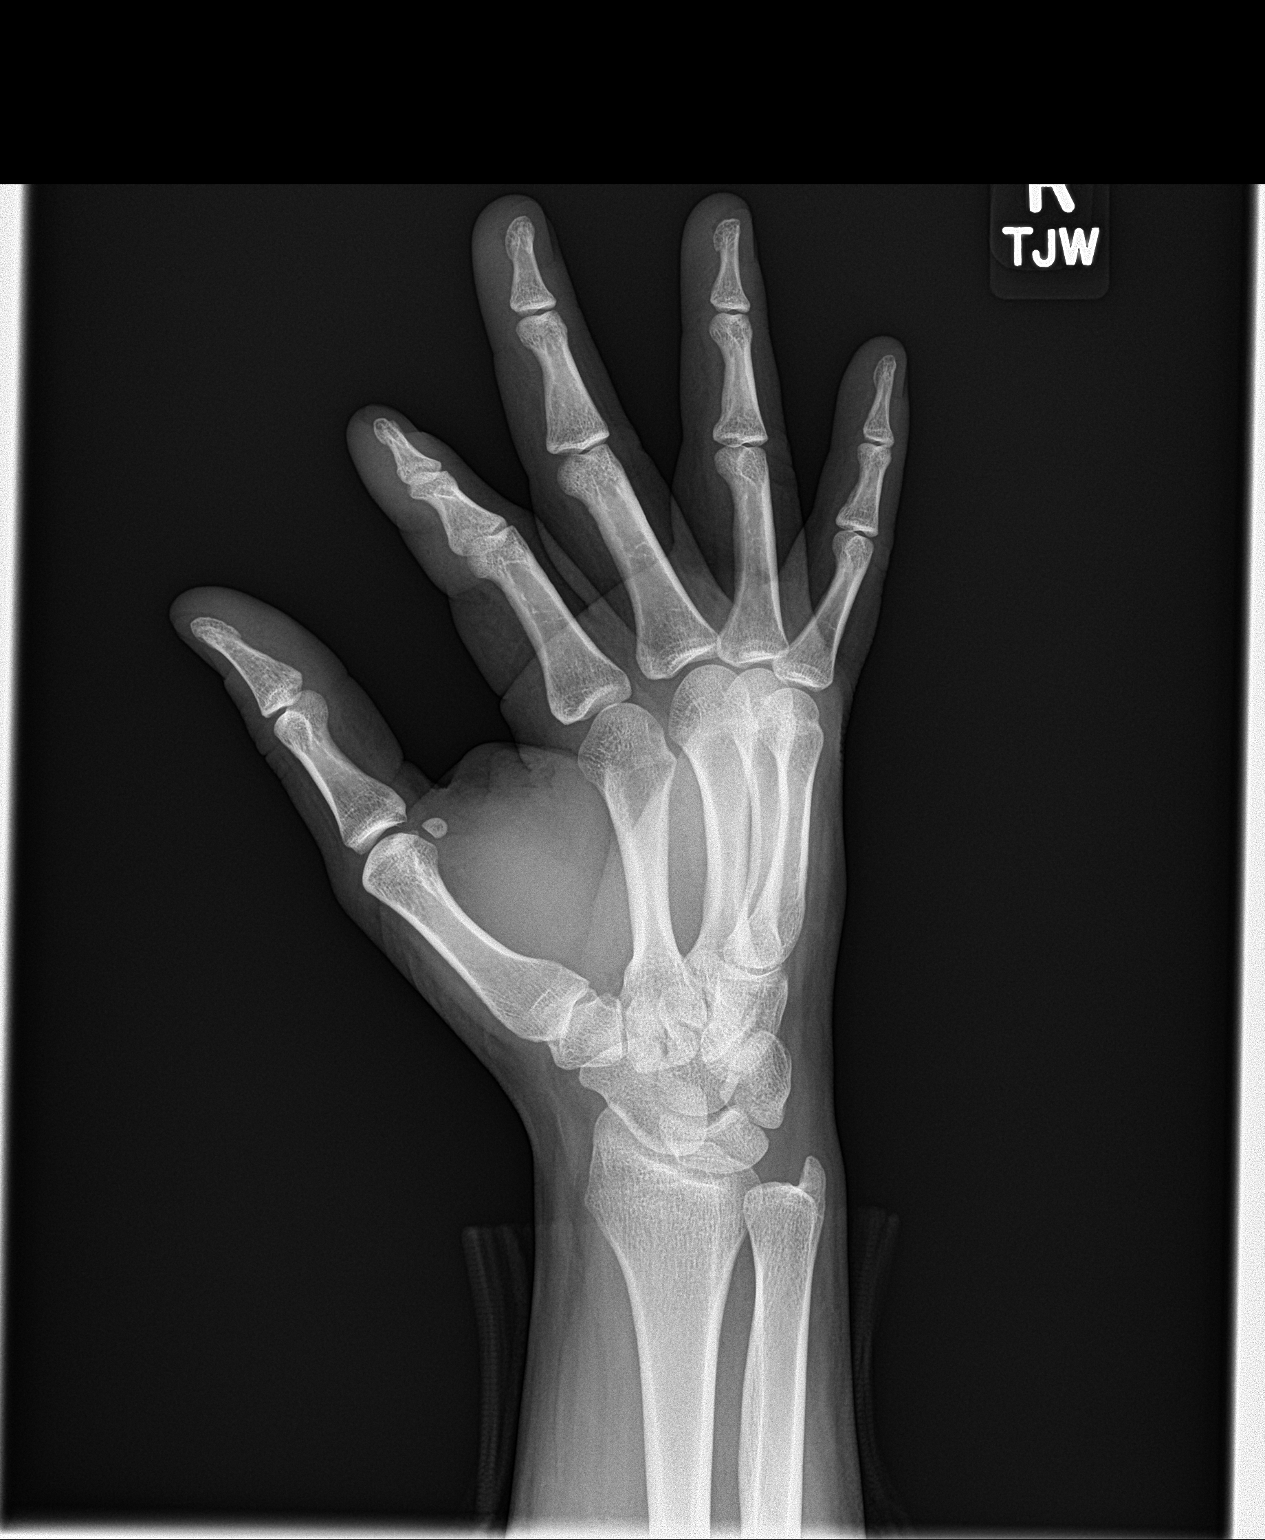

[3 of 3 positions shown; findings below may reference images not displayed]

FINDINGS: There is no evidence of fracture or dislocation. There is no
evidence of arthropathy or other focal bone abnormality. Soft
tissues are unremarkable.
IMPRESSION: Negative.

## 2021-03-22 ENCOUNTER — Ambulatory Visit: Payer: BC Managed Care – PPO | Admitting: Primary Care

## 2021-03-22 ENCOUNTER — Encounter: Payer: Self-pay | Admitting: Primary Care

## 2021-03-22 ENCOUNTER — Other Ambulatory Visit: Payer: Self-pay

## 2021-03-22 VITALS — BP 118/74 | HR 88 | Temp 98.3°F | Ht 70.0 in | Wt 256.0 lb

## 2021-03-22 DIAGNOSIS — Z23 Encounter for immunization: Secondary | ICD-10-CM

## 2021-03-22 DIAGNOSIS — L989 Disorder of the skin and subcutaneous tissue, unspecified: Secondary | ICD-10-CM

## 2021-03-22 DIAGNOSIS — Z1159 Encounter for screening for other viral diseases: Secondary | ICD-10-CM

## 2021-03-22 DIAGNOSIS — Z114 Encounter for screening for human immunodeficiency virus [HIV]: Secondary | ICD-10-CM

## 2021-03-22 NOTE — Progress Notes (Signed)
Subjective:    Patient ID: Anthony Ochoa, male    DOB: July 15, 1986, 35 y.o.   MRN: [id number]  HPI  Anthony Ochoa is a very pleasant 35 y.o. male who presents today for evaluation of skin mass.   The skin mass is located on the posterior shaft of the penis for which he just noticed 2 weeks ago while in the shower. Initially the mass was red around the edges, that has abated. Since then the mass is about the same size. He believes the mass has raised some.   He denies pain, changes in clothing/underwear/medications, rashes, known history of genital warts or other STD's. He also denies any testicular involvement.  Review of Systems  Genitourinary:  Negative for dysuria, penile pain, penile swelling, scrotal swelling and testicular pain.  Skin:  Negative for color change and wound.       Skin mass        Past Medical History:  Diagnosis Date   Allergy    Chickenpox    Croup    GERD (gastroesophageal reflux disease)    Migraines     Social History   Socioeconomic History   Marital status: Married    Spouse name: Not on file   Number of children: Not on file   Years of education: Not on file   Highest education level: Not on file  Occupational History   Not on file  Tobacco Use   Smoking status: Never   Smokeless tobacco: Never  Substance and Sexual Activity   Alcohol use: Yes   Drug use: Never   Sexual activity: Not on file  Other Topics Concern   Not on file  Social History Narrative   Married.   2 children.   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    Social Determinants of Health   Financial Resource Strain: Not on file  Food Insecurity: Not on file  Transportation Needs: Not on file  Physical Activity: Not on file  Stress: Not on file  Social Connections: Not on file  Intimate Partner Violence: Not on file    Past Surgical History:  Procedure Laterality Date   TONSILLECTOMY  2002    Family History  Problem Relation Age of Onset   Miscarriages /  [place] Mother    Alcohol abuse Father    Hyperlipidemia Maternal Grandmother    Hypertension Maternal Grandmother    Heart disease Maternal Grandfather    Hyperlipidemia Maternal Grandfather    Hypertension Maternal Grandfather    Stroke Maternal Grandfather     Allergies  Allergen Reactions   Penicillins Rash    Current Outpatient Medications on File Prior to Visit  Medication Sig Dispense Refill   cetirizine (ZYRTEC) 10 MG tablet Take 10 mg by mouth daily.     fluticasone (FLONASE) 50 MCG/ACT nasal spray Place 2 sprays into both nostrils daily. 15.8 mL 1   Multiple Vitamins-Minerals (MULTIVITAMIN MEN PO) Take by mouth daily.     rizatriptan (MAXALT) 10 MG tablet Take 1 tablet (10 mg total) by mouth as needed for migraine. May repeat in 2 hours if needed 10 tablet 0   No current facility-administered medications on file prior to visit.    BP 118/74   Pulse 88   Temp 98.3 F (36.8 C) (Temporal)   Ht 5\' 10"  (1.778 m)   Wt 256 lb (116.1 kg)   SpO2 98%   BMI 36.73 kg/m  Objective:   Physical Exam Genitourinary:    Penis: No  erythema or tenderness.      Testes: Normal.     Comments: 3 mm rounded, raised, flesh colored mass to posterior shaft of penis proximal to head. Non tender. No surrounding erythema.  Neurological:     Mental Status: He is alert.          Assessment & Plan:      This visit occurred during the SARS-CoV-2 public health emergency.  Safety protocols were in place, including screening questions prior to the visit, additional usage of staff PPE, and extensive cleaning of exam room while observing appropriate contact time as indicated for disinfecting solutions.

## 2021-03-22 NOTE — Patient Instructions (Addendum)
Monitor the site as discussed. Report an crater like appearance, color changes, rapid growth.   It was a pleasure to see you today!         Influenza (Flu) Vaccine (Inactivated or Recombinant): What You Need to Know 1. Why get vaccinated? Influenza vaccine can prevent influenza (flu). Flu is a contagious disease that spreads around the [place] every year, usually between October and May. Anyone can get the flu, but it is more dangerous for some people. Infants and young children, people 69 years and older, pregnant people, and people with certain health conditions or a weakened immune system are at greatest risk of flu complications. Pneumonia, bronchitis, sinus infections, and ear infections are examples of flu-related complications. If you have a medical condition, such as heart disease, cancer, or diabetes, flu can make it worse. Flu can cause fever and chills, sore throat, muscle aches, fatigue, cough, headache, and runny or stuffy nose. Some people may have vomiting and diarrhea, though this is more common in children than adults. In an average year, thousands of people in the [place] die from flu, and many more are hospitalized. Flu vaccine prevents millions of illnesses and flu-related visits to the doctor each year. 2. Influenza vaccines CDC recommends everyone 6 months and older get vaccinated every flu season. Children 6 months through 29 years of age may need 2 doses during a single flu season. Everyone else needs only 1 dose each flu season. It takes about 2 weeks for protection to develop after vaccination. There are many flu viruses, and they are always changing. Each year a new flu vaccine is made to protect against the influenza viruses believed to be likely to cause disease in the upcoming flu season. Even when the vaccine doesn't exactly match these viruses, it may still provide some protection. Influenza vaccine does not cause flu. Influenza vaccine may be given  at the same time as other vaccines. 3. Talk with your health care provider Tell your vaccination provider if the person getting the vaccine: Has had an allergic reaction after a previous dose of influenza vaccine, or has any severe, life-threatening allergies Has ever had Guillain-Barr Syndrome (also called "GBS") In some cases, your health care provider may decide to postpone influenza vaccination until a future visit. Influenza vaccine can be administered at any time during pregnancy. People who are or will be pregnant during influenza season should receive inactivated influenza vaccine. People with minor illnesses, such as a cold, may be vaccinated. People who are moderately or severely ill should usually wait until they recover before getting influenza vaccine. Your health care provider can give you more information. 4. Risks of a vaccine reaction Soreness, redness, and swelling where the shot is given, fever, muscle aches, and headache can happen after influenza vaccination. There may be a very small increased risk of Guillain-Barr Syndrome (GBS) after inactivated influenza vaccine (the flu shot). Young children who get the flu shot along with pneumococcal vaccine (PCV13) and/or DTaP vaccine at the same time might be slightly more likely to have a seizure caused by fever. Tell your health care provider if a child who is getting flu vaccine has ever had a seizure. People sometimes faint after medical procedures, including vaccination. Tell your provider if you feel dizzy or have vision changes or ringing in the ears. As with any medicine, there is a very remote chance of a vaccine causing a severe allergic reaction, other serious injury, or death. 5. What if there is a  serious problem? An allergic reaction could occur after the vaccinated person leaves the clinic. If you see signs of a severe allergic reaction (hives, swelling of the face and throat, difficulty breathing, a fast heartbeat,  dizziness, or weakness), call 9-1-1 and get the person to the nearest hospital. For other signs that concern you, call your health care provider. Adverse reactions should be reported to the Vaccine Adverse Event Reporting System (VAERS). Your health care provider will usually file this report, or you can do it yourself. Visit the VAERS website at www.vaers.LAgents.no or call 972 488 9880. VAERS is only for reporting reactions, and VAERS staff members do not give medical advice. 6. The National Vaccine Injury Compensation Program The [employer] Vaccine Injury Compensation Program (VICP) is a federal program that was created to compensate people who may have been injured by certain vaccines. Claims regarding alleged injury or death due to vaccination have a time limit for filing, which may be as short as two years. Visit the VICP website at SpiritualWord.at or call (913)693-0358 to learn about the program and about filing a claim. 7. How can I learn more? Ask your health care provider. Call your local or state health department. Visit the website of the Food and Drug Administration (FDA) for vaccine package inserts and additional information at FinderList.no. Contact the Centers for Disease Control and Prevention (CDC): Call 814 634 0506 (1-800-CDC-INFO) or Visit CDC's website at BiotechRoom.com.cy. Vaccine Information Statement Inactivated Influenza Vaccine (02/25/2020) This information is not intended to replace advice given to you by your health care provider. Make sure you discuss any questions you have with your health care provider. Document Revised: 04/13/2020 Document Reviewed: 04/13/2020 Elsevier Patient Education  2022 [employer].

## 2021-03-22 NOTE — Assessment & Plan Note (Signed)
Acute to penile shaft x 2 weeks.  Exam today with unclear etiology. Low suspicion for carcinoma.  No infection.  Checking labs including HSV, RPR.  Also recommended dermatology if no improvement/and or for continued growth.

## 2021-03-23 DIAGNOSIS — Z23 Encounter for immunization: Secondary | ICD-10-CM

## 2021-03-23 DIAGNOSIS — L989 Disorder of the skin and subcutaneous tissue, unspecified: Secondary | ICD-10-CM | POA: Diagnosis not present

## 2021-03-23 LAB — HIV ANTIBODY (ROUTINE TESTING W REFLEX): HIV Screen 4th Generation wRfx: NONREACTIVE

## 2021-03-23 LAB — HEPATITIS C ANTIBODY: Hep C Virus Ab: <0.1 s/co ratio (ref 0.0–0.9)

## 2021-03-23 LAB — RPR: RPR Ser Ql: NONREACTIVE

## 2021-03-23 LAB — HSV(HERPES SIMPLEX VRS) I + II AB-IGG
HSV 1 Glycoprotein G Ab, IgG: <0.91 index (ref 0.00–0.90)
HSV 2 IgG, Type Spec: <0.91 index (ref 0.00–0.90)

## 2021-04-13 DIAGNOSIS — Z23 Encounter for immunization: Secondary | ICD-10-CM | POA: Diagnosis not present

## 2021-07-18 ENCOUNTER — Ambulatory Visit: Payer: BC Managed Care – PPO | Admitting: Family Medicine

## 2021-07-25 ENCOUNTER — Ambulatory Visit: Payer: BC Managed Care – PPO | Admitting: Primary Care

## 2021-07-27 ENCOUNTER — Ambulatory Visit: Payer: BC Managed Care – PPO | Admitting: Primary Care

## 2021-07-27 ENCOUNTER — Other Ambulatory Visit: Payer: Self-pay

## 2021-07-27 VITALS — BP 118/62 | HR 75 | Temp 98.2°F | Ht 70.0 in | Wt 253.0 lb

## 2021-07-27 DIAGNOSIS — Z23 Encounter for immunization: Secondary | ICD-10-CM | POA: Diagnosis not present

## 2021-07-27 DIAGNOSIS — R350 Frequency of micturition: Secondary | ICD-10-CM | POA: Insufficient documentation

## 2021-07-27 DIAGNOSIS — M7712 Lateral epicondylitis, left elbow: Secondary | ICD-10-CM | POA: Diagnosis not present

## 2021-07-27 LAB — POCT GLYCOSYLATED HEMOGLOBIN (HGB A1C): Hemoglobin A1C: 5 % (ref 4.0–5.6)

## 2021-07-27 LAB — POC URINALSYSI DIPSTICK (AUTOMATED)
Bilirubin, UA: NEGATIVE
Blood, UA: NEGATIVE
Glucose, UA: NEGATIVE
Ketones, UA: NEGATIVE
Leukocytes, UA: NEGATIVE
Nitrite, UA: NEGATIVE
Protein, UA: NEGATIVE
Spec Grav, UA: 1.02 (ref 1.010–1.025)
Urobilinogen, UA: 0.2 E.U./dL
pH, UA: 6.5 (ref 5.0–8.0)

## 2021-07-27 MED ORDER — PREDNISONE 20 MG PO TABS
ORAL_TABLET | ORAL | 0 refills | Status: DC
Start: 2021-07-27 — End: 2021-09-12

## 2021-07-27 NOTE — Progress Notes (Signed)
Subjective:    Patient ID: Anthony Ochoa, male    DOB: 12-Nov-1985, 36 y.o.   MRN: [id number]  HPI  Anthony Ochoa is a very pleasant 36 y.o. male with a history of migraines, GERD, acute elbow pain, palpitations who presents today to discuss urinary frequency and extremity pain.  1) Urinary Frequency: Acute for the last two weeks with mid lower back pain with radiation to right back down through the right lower extremity to the ankle. Urinary frequency and back pain began at the same time, but back pain and radiculopathy was worse with urinary frequency.  He began to improve his water intake and also his diet. Some increased thirst. Prior to symptom onset he had a death in the family and wasn't hydrating well or eating the best.   Urinary frequency has since abated as of four days ago, his lower back pain with sciatica has improved and sciatica only goes down to mid posterior thigh now.   He denies flank pain, radiation to groin, hematuria, loss of bowel/bladder control, numbness/tingling, lower extremity weakness. He did take Ibuprofen a few times with improvement to his back.  2) Extremity Pain: Chronic to the left elbow and upper forearm for > 6 months. Intermittent. Last flare was in Fall 2021, evaluated by Dr. Lorelei Pont and symptoms completely resolved with prednisone course.  Recent symptoms began about two weeks ago after moving a heavy garbage can and taking a load of objects to the dump. Pain is located to the lateral elbow down to the lateral part of the upper forearm. Same location as last year.   Pain worse with turning objects, picking up a gallon of milk, picking up his kids or any heavy object, taking out trash.   He's worn a brace and tried some exercises with little improvement. He's had no flares since Fall 2021.   Review of Systems  Genitourinary:  Negative for dysuria, frequency and hematuria.       See HPI  Musculoskeletal:  Positive for arthralgias, back pain and  myalgias.  Neurological:  Negative for numbness.        Past Medical History:  Diagnosis Date   Allergy    Chickenpox    Croup    GERD (gastroesophageal reflux disease)    Migraines     Social History   Socioeconomic History   Marital status: Married    Spouse name: Not on file   Number of children: Not on file   Years of education: Not on file   Highest education level: Not on file  Occupational History   Not on file  Tobacco Use   Smoking status: Never   Smokeless tobacco: Never  Substance and Sexual Activity   Alcohol use: Yes   Drug use: Never   Sexual activity: Not on file  Other Topics Concern   Not on file  Social History Narrative   Married.   2 children.   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    Social Determinants of Health   Financial Resource Strain: Not on file  Food Insecurity: Not on file  Transportation Needs: Not on file  Physical Activity: Not on file  Stress: Not on file  Social Connections: Not on file  Intimate Partner Violence: Not on file    Past Surgical History:  Procedure Laterality Date   TONSILLECTOMY  2002    Family History  Problem Relation Age of Onset   Miscarriages / [place] Mother    Alcohol abuse Father  Hyperlipidemia Maternal Grandmother    Hypertension Maternal Grandmother    Heart disease Maternal Grandfather    Hyperlipidemia Maternal Grandfather    Hypertension Maternal Grandfather    Stroke Maternal Grandfather     Allergies  Allergen Reactions   Penicillins Rash    Current Outpatient Medications on File Prior to Visit  Medication Sig Dispense Refill   cetirizine (ZYRTEC) 10 MG tablet Take 10 mg by mouth daily.     fluticasone (FLONASE) 50 MCG/ACT nasal spray Place 2 sprays into both nostrils daily. 15.8 mL 1   Multiple Vitamins-Minerals (MULTIVITAMIN MEN PO) Take by mouth daily.     rizatriptan (MAXALT) 10 MG tablet Take 1 tablet (10 mg total) by mouth as needed for migraine. May repeat in 2  hours if needed 10 tablet 0   No current facility-administered medications on file prior to visit.    BP 118/62    Pulse 75    Temp 98.2 F (36.8 C) (Temporal)    Ht 5\' 10"  (1.778 m)    Wt 253 lb (114.8 kg)    SpO2 97%    BMI 36.30 kg/m  Objective:   Physical Exam Cardiovascular:     Rate and Rhythm: Normal rate and regular rhythm.  Pulmonary:     Effort: Pulmonary effort is normal.     Breath sounds: Normal breath sounds. No wheezing or rales.  Musculoskeletal:     Right elbow: Normal range of motion.     Left elbow: Normal range of motion.       Arms:     Cervical back: Neck supple.     Lumbar back: Normal range of motion. Negative right straight leg raise test and negative left straight leg raise test.       Back:     Comments: Strength 5/5 to bilateral upper and lower extremities.  Skin:    General: Skin is warm and dry.  Neurological:     Mental Status: He is alert and oriented to person, place, and time.          Assessment & Plan:      This visit occurred during the SARS-CoV-2 public health emergency.  Safety protocols were in place, including screening questions prior to the visit, additional usage of staff PPE, and extensive cleaning of exam room while observing appropriate contact time as indicated for disinfecting solutions.

## 2021-07-27 NOTE — Assessment & Plan Note (Signed)
Unclear cause.  UA today negative and is without evidence of glucose and infect. It doesn't seem like he passed a renal stone.  POC A1C today of 5.0 which does not suggest prediabetes or diabetes.   Encouraged healthy diet, good water intake, limit caffeine.

## 2021-07-27 NOTE — Patient Instructions (Signed)
Start prednisone 20 mg tablets. Take 2 tablets by mouth once daily for 5 days.  It was a pleasure to see you today!   

## 2021-07-27 NOTE — Assessment & Plan Note (Signed)
Acute on chronic flare for which are overall infrequent.  Discussed options. We will start with prednisone course as this was beneficial previously. If no improvement, and/or if symptoms become more recurrent, we will proceed with ortho referral.  He agrees.  Follow up PRN.

## 2021-08-22 DIAGNOSIS — M7712 Lateral epicondylitis, left elbow: Secondary | ICD-10-CM

## 2021-09-03 DIAGNOSIS — M7712 Lateral epicondylitis, left elbow: Secondary | ICD-10-CM | POA: Diagnosis not present

## 2021-09-12 ENCOUNTER — Other Ambulatory Visit: Payer: Self-pay

## 2021-09-12 ENCOUNTER — Ambulatory Visit: Payer: BC Managed Care – PPO | Admitting: Nurse Practitioner

## 2021-09-12 VITALS — BP 128/83 | HR 77 | Temp 98.8°F | Resp 16

## 2021-09-12 DIAGNOSIS — Z20822 Contact with and (suspected) exposure to covid-19: Secondary | ICD-10-CM

## 2021-09-12 DIAGNOSIS — R0981 Nasal congestion: Secondary | ICD-10-CM

## 2021-09-12 DIAGNOSIS — J0140 Acute pansinusitis, unspecified: Secondary | ICD-10-CM

## 2021-09-12 LAB — POC COVID19 BINAXNOW: SARS Coronavirus 2 Ag: NEGATIVE

## 2021-09-12 MED ORDER — FLUTICASONE PROPIONATE 50 MCG/ACT NA SUSP: 2.0000 | Freq: Every day | NASAL | 1 refills | Status: DC

## 2021-09-12 MED ORDER — DOXYCYCLINE HYCLATE 100 MG PO TABS: 100.0000 mg | ORAL_TABLET | Freq: Two times a day (BID) | ORAL | 0 refills | Status: AC

## 2021-09-12 NOTE — Progress Notes (Signed)
° °  Subjective:    Patient ID: Anthony Ochoa, male    DOB: November 06, 1985, 36 y.o.   MRN: [id number]  HPI  36 year old male presenting with one week of URI symptoms  Chills headache and cough onset 8 days ago.  Over the weekend he started to have improvement with Dayquil/Nyquil  Then symptoms started to worsen over the past 48 hours with more sinus congestion, darker drainage and ear pain  Denies a history of COVID infection Took a COVID test at the start of illness was negative   He has been vaccinated for COVID and Flu   Today's Vitals   09/12/21 1452  BP: 128/83  Pulse: 77  Resp: 16  Temp: 98.8 F (37.1 C)  TempSrc: Tympanic  SpO2: 97%   There is no height or weight on file to calculate BMI.   Review of Systems  Constitutional: Negative.   HENT:  Positive for congestion, ear pain and sinus pressure.   Eyes: Negative.   Respiratory:  Positive for cough.   Cardiovascular: Negative.   Gastrointestinal: Negative.   Genitourinary: Negative.   Musculoskeletal: Negative.   Neurological: Negative.       Objective:   Physical Exam Constitutional:      Appearance: He is ill-appearing.  HENT:     Head: Normocephalic.     Right Ear: A middle ear effusion is present. Tympanic membrane is not erythematous.     Left Ear: A middle ear effusion is present. Tympanic membrane is erythematous.     Nose: Congestion present.     Right Turbinates: Enlarged.     Left Turbinates: Enlarged.     Right Sinus: Maxillary sinus tenderness present.     Left Sinus: Maxillary sinus tenderness present.     Mouth/Throat:     Mouth: Mucous membranes are moist.     Pharynx: Posterior oropharyngeal erythema present. No oropharyngeal exudate.  Eyes:     Pupils: Pupils are equal, round, and reactive to light.  Cardiovascular:     Rate and Rhythm: Normal rate and regular rhythm.     Heart sounds: Normal heart sounds.  Pulmonary:     Effort: Pulmonary effort is normal.     Breath sounds: Normal  breath sounds.  Musculoskeletal:        General: Normal range of motion.     Cervical back: Normal range of motion.  Skin:    General: Skin is warm.  Neurological:     General: No focal deficit present.     Mental Status: He is alert.  Psychiatric:        Mood and Affect: Mood normal.          Assessment & Plan:  1. Sinus congestion  - fluticasone (FLONASE) 50 MCG/ACT nasal spray; Place 2 sprays into both nostrils daily.  Dispense: 15.8 mL; Refill: 1  2. Acute non-recurrent pansinusitis  - doxycycline (VIBRA-TABS) 100 MG tablet; Take 1 tablet (100 mg total) by mouth 2 (two) times daily for 7 days. Take with food  Dispense: 14 tablet; Refill: 0  3. Encounter for screening laboratory testing for COVID-19 virus  - POC COVID-19 negative   Continue dayquil/Nyquil for the next 2-3 days as needed. Push fluids rest  OK to return to work

## 2021-09-19 ENCOUNTER — Ambulatory Visit: Payer: BC Managed Care – PPO | Admitting: Primary Care

## 2021-11-13 ENCOUNTER — Ambulatory Visit: Payer: BC Managed Care – PPO | Admitting: Family Medicine

## 2021-11-13 ENCOUNTER — Encounter: Payer: Self-pay | Admitting: Family Medicine

## 2021-11-13 VITALS — BP 118/68 | HR 88 | Temp 98.6°F | Resp 16 | Ht 70.0 in | Wt 258.5 lb

## 2021-11-13 DIAGNOSIS — J0100 Acute maxillary sinusitis, unspecified: Secondary | ICD-10-CM | POA: Insufficient documentation

## 2021-11-13 HISTORY — DX: Acute maxillary sinusitis, unspecified: J01.00

## 2021-11-13 MED ORDER — AZITHROMYCIN 250 MG PO TABS: ORAL_TABLET | ORAL | 0 refills | Status: DC

## 2021-11-13 NOTE — Progress Notes (Signed)
? ? Patient ID: Anthony Ochoa, male    DOB: 07-20-1986, 36 y.o.   MRN: [id number] ? ?This visit was conducted in person. ? ?BP 118/68   Pulse 88   Temp 98.6 ?F (37 ?C)   Resp 16   Ht 5\' 10"  (1.778 m)   Wt 258 lb 8 oz (117.3 kg)   SpO2 96%   BMI 37.09 kg/m?   ? ?CC:  ?Chief Complaint  ?Patient presents with  ? Sinus Problem  ?  X week started Advil helps little.   ? ? ?Subjective:  ? ?HPI: ?Anthony Ochoa is a 36 y.o. male presenting on 11/13/2021 for Sinus Problem (X week started Advil helps little. ) ? ? Date of onset: 1 week ago. ? ?He reports initial symptoms of  sinus congestion/allergies after cutting grass. Sinus pressure. ?Symptoms have progressed to bIlateral ear popping, right ear pain. ? Yellow green nasal discharge. Sore throat.. ? No fever. Occ chills. ?  Occ body aches. He is tired. ? Facial pressure. ? ? Using flonase  1  spray and zyrtec usual. ?He has tried to treat with Advil  sinus and pain which helps a little with the sinus pain. ?Using ? ? No sick contacts. ? ? ? COVID 19 screen ?COVID testing: negative home test on 4/23 and 4/24.  ?COVID vaccine: ?06/20/2020  Imm Admin: PFIZER(Purple Top)SARS-COV-2 Vaccination   ?10/21/2019  Imm Admin: PFIZER(Purple Top)SARS-COV-2 Vaccination   ?09/20/2019  Imm Admin: PFIZER(Purple Top)SARS-COV-2 Vaccination   ? ?COVID exposure: No recent travel or known exposure to COVID19 ? The importance of social distancing was discussed today.  ? ? ?Relevant past medical, surgical, family and social history reviewed and updated as indicated. Interim medical history since our last visit reviewed. ?Allergies and medications reviewed and updated. ?Outpatient Medications Prior to Visit  ?Medication Sig Dispense Refill  ? cetirizine (ZYRTEC) 10 MG tablet Take 10 mg by mouth daily.    ? fluticasone (FLONASE) 50 MCG/ACT nasal spray Place 2 sprays into both nostrils daily. 15.8 mL 1  ? Multiple Vitamins-Minerals (MULTIVITAMIN MEN PO) Take by mouth daily.    ? rizatriptan  (MAXALT) 10 MG tablet Take 1 tablet (10 mg total) by mouth as needed for migraine. May repeat in 2 hours if needed 10 tablet 0  ? ?No facility-administered medications prior to visit.  ?  ? ?Per HPI unless specifically indicated in ROS section below ?Review of Systems  ?Constitutional:  Positive for chills. Negative for fatigue and fever.  ?HENT:  Positive for congestion, ear pain, postnasal drip, sinus pressure, sinus pain, sneezing and sore throat.   ?Eyes:  Negative for pain.  ?Respiratory:  Negative for cough and shortness of breath.   ?Cardiovascular:  Negative for chest pain, palpitations and leg swelling.  ?Gastrointestinal:  Negative for abdominal pain.  ?Genitourinary:  Negative for dysuria.  ?Musculoskeletal:  Positive for myalgias. Negative for arthralgias.  ?Neurological:  Negative for syncope, light-headedness and headaches.  ?Psychiatric/Behavioral:  Negative for dysphoric mood.   ?Objective:  ?BP 118/68   Pulse 88   Temp 98.6 ?F (37 ?C)   Resp 16   Ht 5\' 10"  (1.778 m)   Wt 258 lb 8 oz (117.3 kg)   SpO2 96%   BMI 37.09 kg/m?   ?Wt Readings from Last 3 Encounters:  ?11/13/21 258 lb 8 oz (117.3 kg)  ?07/27/21 253 lb (114.8 kg)  ?03/22/21 256 lb (116.1 kg)  ?  ?  ?Physical Exam ?Constitutional:   ?  Appearance: He is well-developed.  ?HENT:  ?   Head: Normocephalic.  ?   Right Ear: Hearing normal. A middle ear effusion is present.  ?   Left Ear: Hearing normal. A middle ear effusion is present. Tympanic membrane is injected.  ?   Nose: Mucosal edema and congestion present.  ?   Mouth/Throat:  ?   Lips: Pink.  ?   Mouth: Mucous membranes are moist.  ?   Dentition: Normal dentition.  ?   Tongue: No lesions.  ?   Palate: No mass.  ?   Pharynx: Oropharynx is clear. No pharyngeal swelling, oropharyngeal exudate or posterior oropharyngeal erythema.  ?   Tonsils: No tonsillar exudate or tonsillar abscesses.  ?Neck:  ?   Thyroid: No thyroid mass or thyromegaly.  ?   Vascular: No carotid bruit.  ?    Trachea: Trachea normal.  ?Cardiovascular:  ?   Rate and Rhythm: Normal rate and regular rhythm.  ?   Pulses: Normal pulses.  ?   Heart sounds: Heart sounds not distant. No murmur heard. ?  No friction rub. No gallop.  ?   Comments: No peripheral edema ?Pulmonary:  ?   Effort: Pulmonary effort is normal. No respiratory distress.  ?   Breath sounds: Normal breath sounds.  ?Skin: ?   General: Skin is warm and dry.  ?   Findings: No rash.  ?Psychiatric:     ?   Speech: Speech normal.     ?   Behavior: Behavior normal.     ?   Thought Content: Thought content normal.  ? ?   ?Results for orders placed or performed in visit on 09/12/21  ?POC COVID-19  ?Result Value Ref Range  ? SARS Coronavirus 2 Ag Negative Negative  ? ? ?This visit occurred during the SARS-CoV-2 public health emergency.  Safety protocols were in place, including screening questions prior to the visit, additional usage of staff PPE, and extensive cleaning of exam room while observing appropriate contact time as indicated for disinfecting solutions.  ? ?COVID 19 screen:  No recent travel or known exposure to COVID19 ?The patient denies respiratory symptoms of COVID 19 at this time. ?The importance of social distancing was discussed today.  ? ?Assessment and Plan. ?Problem List Items Addressed This Visit   ? ? Acute non-recurrent maxillary sinusitis - Primary  ?  Acute, worsening ? ?Given symptoms greater than 7 to 10 days I will treat with antibiotics (azithromycin x5 days).  He will also increase Flonase to 2 sprays per nostril daily and add nasal saline spray or irrigation.  He will stop Afrin as he has used it 3 days.Marland Kitchen ?If he is not turning the corner and improving in the next 3 to 4 days we can can consider a course of prednisone to treat eustachian tube dysfunction and sinus inflammation. ? ?  ?  ? Relevant Medications  ? azithromycin (ZITHROMAX) 250 MG tablet  ? ? ?Meds ordered this encounter  ?Medications  ? azithromycin (ZITHROMAX) 250 MG tablet   ?  Sig: 2 tab po x 1 day then 1 tab po daily  ?  Dispense:  6 tablet  ?  Refill:  0  ? ? ?  ? ?Kerby Nora, MD  ? ?

## 2021-11-13 NOTE — Patient Instructions (Signed)
Increase flonase to 2 spray per nostril per day. ? Start nasal saline 2-3 times per day. ? Stop afrin. ? Continue advil sinus as needed. ? Call if not improving as expected. ?

## 2021-11-13 NOTE — Assessment & Plan Note (Signed)
Acute, worsening ? ?Given symptoms greater than 7 to 10 days I will treat with antibiotics (azithromycin x5 days).  He will also increase Flonase to 2 sprays per nostril daily and add nasal saline spray or irrigation.  He will stop Afrin as he has used it 3 days.Marland Kitchen ?If he is not turning the corner and improving in the next 3 to 4 days we can can consider a course of prednisone to treat eustachian tube dysfunction and sinus inflammation. ?

## 2021-11-14 ENCOUNTER — Ambulatory Visit: Payer: BC Managed Care – PPO | Admitting: Family Medicine

## 2022-01-04 DIAGNOSIS — G43909 Migraine, unspecified, not intractable, without status migrainosus: Secondary | ICD-10-CM

## 2022-01-07 MED ORDER — RIZATRIPTAN BENZOATE 10 MG PO TABS: 10.0000 mg | ORAL_TABLET | ORAL | 0 refills | Status: DC | PRN

## 2022-01-14 DIAGNOSIS — R0981 Nasal congestion: Secondary | ICD-10-CM

## 2022-01-14 MED ORDER — FLUTICASONE PROPIONATE 50 MCG/ACT NA SUSP: 2.0000 | Freq: Every day | NASAL | 1 refills | Status: DC

## 2022-01-14 NOTE — Telephone Encounter (Signed)
Script was given by you last.

## 2022-01-18 ENCOUNTER — Encounter: Payer: Self-pay | Admitting: Primary Care

## 2022-01-18 ENCOUNTER — Ambulatory Visit: Payer: BC Managed Care – PPO | Admitting: Primary Care

## 2022-01-18 DIAGNOSIS — G43709 Chronic migraine without aura, not intractable, without status migrainosus: Secondary | ICD-10-CM

## 2022-01-18 MED ORDER — CYCLOBENZAPRINE HCL 5 MG PO TABS: 5.0000 mg | ORAL_TABLET | Freq: Every day | ORAL | 0 refills | Status: AC | PRN

## 2022-01-18 NOTE — Progress Notes (Signed)
Subjective:    Patient ID: Anthony Ochoa, male    DOB: 16-May-1986, 36 y.o.   MRN: [id number]  Migraine  Associated symptoms include nausea and photophobia. Pertinent negatives include no vomiting.    Anthony Ochoa is a very pleasant 36 y.o. male with a history of migraines, GERD, seasonal and environmental allergies who presents today to discuss migraines.  Migraines have increased in frequency and intensity as of about 6 weeks ago. Prior to 6 weeks ago, his last migraine was one year prior.    He's had three migraines within the last six weeks. This week he experienced his most intense migraine which woke him from sleep three nights ago. His migraine felt like "a pick axe" to the top of the parietal lobe. He developed photophobia. He felt no improvement with the first dose of his rizatriptan, some improvement with the second dose. Two days ago he woke up feeling 50% better, migraine shifted to a "band-like pressure" around his head. He took another rizatriptan in the morning and again in the afternoon. Yesterday he woke up "with a dull ache" to the upper, mid occipital lobe, took another rizatriptan with complete resolve late yesterday. He has been taking Ibuprofen intermittently over the week.   He does recall running out of his Flonase prior to his migraine, he believes he was out of his Flonase for about 5 days.  During this time he was at the beach.  He's unsure of the trigger for his migraines, typically attributed them to allergies or stress. He's feeling much less stressed with work since changing occupations. 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 He denies excessive caffeine consumption.   He experiences headaches, once every two weeks, takes Ibuprofen and headache will resolve in 2 hours.   Currently managed on rizatriptan 10 mg PRN for migraine abortion.    BP Readings from Last 3 Encounters:  01/18/22 126/84  11/13/21 118/68  09/12/21 128/83      Review of Systems   Eyes:  Positive for photophobia.  Gastrointestinal:  Positive for nausea. Negative for vomiting.  Neurological:  Positive for headaches.  Psychiatric/Behavioral:  The patient is not nervous/anxious.          Past Medical History:  Diagnosis Date   Acute non-recurrent maxillary sinusitis 11/13/2021   Allergy    Chickenpox    Croup    GERD (gastroesophageal reflux disease)    Migraines     Social History   Socioeconomic History   Marital status: Married    Spouse name: Not on file   Number of children: Not on file   Years of education: Not on file   Highest education level: Not on file  Occupational History   Not on file  Tobacco Use   Smoking status: Never   Smokeless tobacco: Never  Substance and Sexual Activity   Alcohol use: Yes   Drug use: Never   Sexual activity: Not on file  Other Topics Concern   Not on file  Social History Narrative   Married.   2 children.   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    Social Determinants of Health   Financial Resource Strain: Not on file  Food Insecurity: Not on file  Transportation Needs: Not on file  Physical Activity: Not on file  Stress: Not on file  Social Connections: Not on file  Intimate Partner Violence: Not on file    Past Surgical History:  Procedure Laterality Date   TONSILLECTOMY  2002  Family History  Problem Relation Age of Onset   Miscarriages / [place] Mother    Alcohol abuse Father    Hyperlipidemia Maternal Grandmother    Hypertension Maternal Grandmother    Heart disease Maternal Grandfather    Hyperlipidemia Maternal Grandfather    Hypertension Maternal Grandfather    Stroke Maternal Grandfather     Allergies  Allergen Reactions   Penicillins Rash    Current Outpatient Medications on File Prior to Visit  Medication Sig Dispense Refill   cetirizine (ZYRTEC) 10 MG tablet Take 10 mg by mouth daily.     fluticasone (FLONASE) 50 MCG/ACT nasal spray Place 2 sprays into both nostrils  daily. 15.8 mL 1   Multiple Vitamins-Minerals (MULTIVITAMIN MEN PO) Take by mouth daily.     rizatriptan (MAXALT) 10 MG tablet Take 1 tablet (10 mg total) by mouth as needed for migraine. May repeat in 2 hours if needed 10 tablet 0   [DISCONTINUED] fluticasone (FLONASE) 50 MCG/ACT nasal spray Place 2 sprays into both nostrils daily. (Patient not taking: Reported on 09/12/2021) 15.8 mL 1   No current facility-administered medications on file prior to visit.    BP 126/84   Pulse (!) 59   Temp 98.6 F (37 C) (Oral)   Ht 5\' 10"  (1.778 m)   Wt 258 lb (117 kg)   SpO2 97%   BMI 37.02 kg/m  Objective:   Physical Exam Eyes:     Extraocular Movements: Extraocular movements intact.  Cardiovascular:     Rate and Rhythm: Normal rate and regular rhythm.  Pulmonary:     Effort: Pulmonary effort is normal.     Breath sounds: Normal breath sounds. No wheezing or rales.  Musculoskeletal:     Cervical back: Neck supple.  Skin:    General: Skin is warm and dry.  Neurological:     Mental Status: He is alert and oriented to person, place, and time.     Cranial Nerves: No cranial nerve deficit.           Assessment & Plan:   Problem List Items Addressed This Visit       Cardiovascular and Mediastinum   Migraines    Increased in frequency and intensity.  Long discussion regarding treatment options.  We decided to continue with abortive treatment only for now, but if he develops another migraine then we will initiate daily preventative medication.  Continue rizatriptan 10 mg as needed. Add cyclobenzaprine 5 mg as needed.  Consider MRI brain. CT head reviewed from 2016.  He will update.      Relevant Medications   cyclobenzaprine (FLEXERIL) 5 MG tablet     30 minutes spent today with patient discussing his migraines and options for treatment.  See assessment and plan.  2017, NP

## 2022-01-18 NOTE — Assessment & Plan Note (Addendum)
Increased in frequency and intensity.  Long discussion regarding treatment options.  We decided to continue with abortive treatment only for now, but if he develops another migraine then we will initiate daily preventative medication.  Continue rizatriptan 10 mg as needed. Add cyclobenzaprine 5 mg as needed.  Consider MRI brain. CT head reviewed from 2016.  He will update.

## 2022-01-18 NOTE — Patient Instructions (Signed)
Please notify me if you develop another migraine.  You may use the cyclobenzaprine muscle relaxer if needed for the migraine.  Continue rizatriptan as needed for migraine abortion.  It was a pleasure to see you today!

## 2022-01-24 ENCOUNTER — Ambulatory Visit: Payer: Self-pay | Admitting: Family Medicine

## 2022-03-19 ENCOUNTER — Other Ambulatory Visit: Payer: Self-pay | Admitting: Family Medicine

## 2022-03-19 DIAGNOSIS — G43909 Migraine, unspecified, not intractable, without status migrainosus: Secondary | ICD-10-CM

## 2022-03-19 MED ORDER — RIZATRIPTAN BENZOATE 10 MG PO TABS: 10.0000 mg | ORAL_TABLET | ORAL | 0 refills | Status: DC | PRN

## 2022-05-01 ENCOUNTER — Telehealth: Payer: Self-pay

## 2022-05-01 NOTE — Telephone Encounter (Signed)
[facility] Night - Client Nonclinical Telephone Record  [facility] Client [facility] Night - Client Client Site [place] - Night Contact Type Call Who Is Calling Patient / Member / Family / Caregiver Caller Name Sheddrick Lattanzio Caller Phone Number [phone] Patient Name Anthony Ochoa Patient DOB 01-28-1986 Call Type Message Only Information Provided Reason for Call Request to Schedule Office Appointment Initial Comment Caller states he needs to schedule an apt Patient request to speak to RN No Additional Comment Office hours provided. He would like an apt ASAP Disp. Time Disposition Final User 05/01/2022 7:15:30 AM General Information Provided Yes Allene Pyo Call Closed By: Allene Pyo Transaction Date/Time: 05/01/2022 7:13:29 AM (ET

## 2022-05-02 ENCOUNTER — Encounter: Payer: Self-pay | Admitting: Family Medicine

## 2022-05-02 ENCOUNTER — Ambulatory Visit: Payer: BC Managed Care – PPO | Admitting: Family Medicine

## 2022-05-02 VITALS — BP 120/90 | HR 100 | Temp 99.1°F | Ht 70.0 in | Wt 263.2 lb

## 2022-05-02 DIAGNOSIS — R051 Acute cough: Secondary | ICD-10-CM | POA: Insufficient documentation

## 2022-05-02 MED ORDER — AZITHROMYCIN 250 MG PO TABS: ORAL_TABLET | ORAL | 0 refills | Status: DC

## 2022-05-02 MED ORDER — BENZONATATE 200 MG PO CAPS: 200.0000 mg | ORAL_CAPSULE | Freq: Two times a day (BID) | ORAL | 0 refills | Status: DC | PRN

## 2022-05-02 NOTE — Patient Instructions (Signed)
Rest, fluids and time.  Complete the antibiotics.  Continue Flonase and allergy medication.  Can use tylenol for pain.  Call if not improving as expected, o to ER if severe shortness of breath.

## 2022-05-02 NOTE — Progress Notes (Signed)
Patient ID: Anthony Ochoa, male    DOB: 1986-03-30, 36 y.o.   MRN: [id number]  This visit was conducted in person.  BP (!) 120/90   Pulse 100   Temp 99.1 F (37.3 C) (Oral)   Ht 5\' 10"  (1.778 m)   Wt 263 lb 4 oz (119.4 kg)   SpO2 94%   BMI 37.77 kg/m    CC:  Chief Complaint  Patient presents with   Cough    X 1 week Negative Home Covid Test on Tuesday afternoon   Ear Pain   Fatigue        Generalized Body Aches        Headache    Subjective:   HPI: Anthony Ochoa is a 36 y.o. male  allergic rhinitis presenting on 05/02/2022 for Cough (X 1 week/Negative Home Covid Test on Tuesday afternoon), Ear Pain, Fatigue (/), Generalized Body Aches (/), and Headache   Date of onset 1-2 weeks ago  Initial symptoms of  post nasal drip, nasal congestion, dry cough   Progressed to chest congestion, constant cough. Productive now changed to yellow green mucus.  Some chest pain with cough.  He is SOB with exertion but not wheezing. Left ear pain x 24 hours  ST.  Bilateral facial pain.  No fever.  Mild occ body aches, some fatigue.   He has typical sinus symptoms each fall... history of frequent sinus infections.   Negative COVID testing on day 5 of illness. Also had several others.     Son sick with an ear infection.   Using delsym.. helps him sleep through the night.  Used Dayquil yesterday.  BP Readings from Last 3 Encounters:  05/02/22 (!) 120/90  01/18/22 126/84  11/13/21 118/68    No chronic lung disease.   Relevant past medical, surgical, family and social history reviewed and updated as indicated. Interim medical history since our last visit reviewed. Allergies and medications reviewed and updated. Outpatient Medications Prior to Visit  Medication Sig Dispense Refill   cetirizine (ZYRTEC) 10 MG tablet Take 10 mg by mouth daily.     cyclobenzaprine (FLEXERIL) 5 MG tablet Take 1 tablet (5 mg total) by mouth daily as needed. Migraine. 15 tablet 0    fluticasone (FLONASE) 50 MCG/ACT nasal spray Place 2 sprays into both nostrils daily. 15.8 mL 1   Multiple Vitamins-Minerals (MULTIVITAMIN MEN PO) Take by mouth daily.     rizatriptan (MAXALT) 10 MG tablet Take 1 tablet (10 mg total) by mouth as needed for migraine. May repeat in 2 hours if needed 10 tablet 0   No facility-administered medications prior to visit.     Per HPI unless specifically indicated in ROS section below Review of Systems  Constitutional:  Positive for fatigue. Negative for chills and fever.  HENT:  Positive for congestion, rhinorrhea and sinus pain. Negative for ear pain.   Eyes:  Negative for pain.  Respiratory:  Positive for cough and shortness of breath. Negative for wheezing.   Cardiovascular:  Positive for chest pain. Negative for palpitations and leg swelling.  Gastrointestinal:  Negative for abdominal pain.  Genitourinary:  Negative for dysuria.  Musculoskeletal:  Negative for arthralgias.  Neurological:  Negative for syncope, light-headedness and headaches.  Psychiatric/Behavioral:  Negative for dysphoric mood.    Objective:  BP (!) 120/90   Pulse 100   Temp 99.1 F (37.3 C) (Oral)   Ht 5\' 10"  (1.778 m)   Wt 263 lb 4 oz (119.4 kg)  SpO2 94%   BMI 37.77 kg/m   Wt Readings from Last 3 Encounters:  05/02/22 263 lb 4 oz (119.4 kg)  01/18/22 258 lb (117 kg)  11/13/21 258 lb 8 oz (117.3 kg)      Physical Exam Constitutional:      General: He is not in acute distress.    Appearance: Normal appearance. He is well-developed. He is not ill-appearing or toxic-appearing.  HENT:     Head: Normocephalic and atraumatic.     Right Ear: Hearing, tympanic membrane, ear canal and external ear normal. No tenderness. No foreign body. Tympanic membrane is not retracted or bulging.     Left Ear: Hearing, ear canal and external ear normal. No tenderness. A middle ear effusion is present. No foreign body. Tympanic membrane is not retracted or bulging.     Nose: Nose  normal. No mucosal edema or rhinorrhea.     Right Sinus: No maxillary sinus tenderness or frontal sinus tenderness.     Left Sinus: No maxillary sinus tenderness or frontal sinus tenderness.     Mouth/Throat:     Dentition: Normal dentition. No dental caries.     Pharynx: Uvula midline. No oropharyngeal exudate.     Tonsils: No tonsillar abscesses.  Eyes:     General: Lids are normal. Lids are everted, no foreign bodies appreciated.     Conjunctiva/sclera: Conjunctivae normal.     Pupils: Pupils are equal, round, and reactive to light.  Neck:     Thyroid: No thyroid mass or thyromegaly.     Vascular: No carotid bruit.     Trachea: Trachea and phonation normal.  Cardiovascular:     Rate and Rhythm: Normal rate and regular rhythm.     Pulses: Normal pulses.     Heart sounds: Normal heart sounds, S1 normal and S2 normal. No murmur heard.    No gallop.  Pulmonary:     Effort: Pulmonary effort is normal. No respiratory distress.     Breath sounds: Normal breath sounds. No wheezing, rhonchi or rales.     Comments: Constant coughing Abdominal:     General: Bowel sounds are normal.     Palpations: Abdomen is soft.     Tenderness: There is no abdominal tenderness. There is no guarding or rebound.     Hernia: No hernia is present.  Musculoskeletal:     Cervical back: Normal range of motion and neck supple.  Skin:    General: Skin is warm and dry.     Findings: No rash.  Neurological:     Mental Status: He is alert.     Deep Tendon Reflexes: Reflexes are normal and symmetric.  Psychiatric:        Speech: Speech normal.        Behavior: Behavior normal.        Judgment: Judgment normal.       Results for orders placed or performed in visit on 09/12/21  POC COVID-19  Result Value Ref Range   SARS Coronavirus 2 Ag Negative Negative     COVID 19 screen:  No recent travel or known exposure to COVID19 The patient denies respiratory symptoms of COVID 19 at this time. The importance  of social distancing was discussed today.   Assessment and Plan Problem List Items Addressed This Visit     Acute cough - Primary    Acute Symptoms ongoing greater than 7 to 10 days though likely bacterial superinfection on top of viral upper respiratory tract infection.  Not consistent with flu infection and multiple negative COVID testing.  Will treat with azithromycin course.  He will use Flonase 2 sprays per nostril daily and nasal saline to assist with nasal congestion and fluid behind left ear causing ear pain. No sign of wheeze or need for prednisone. Return and ER precautions given         Kerby Nora, MD

## 2022-05-02 NOTE — Assessment & Plan Note (Addendum)
Acute Symptoms ongoing greater than 7 to 10 days though likely bacterial superinfection on top of viral upper respiratory tract infection.  Not consistent with flu infection and multiple negative COVID testing.  Will treat with azithromycin course.  He will use Flonase 2 sprays per nostril daily and nasal saline to assist with nasal congestion and fluid behind left ear causing ear pain. No sign of wheeze or need for prednisone. Return and ER precautions given

## 2022-05-09 ENCOUNTER — Telehealth: Payer: BC Managed Care – PPO | Admitting: Family Medicine

## 2022-05-09 DIAGNOSIS — U071 COVID-19: Secondary | ICD-10-CM

## 2022-05-09 DIAGNOSIS — R0981 Nasal congestion: Secondary | ICD-10-CM

## 2022-05-09 DIAGNOSIS — Z20822 Contact with and (suspected) exposure to covid-19: Secondary | ICD-10-CM

## 2022-05-10 ENCOUNTER — Encounter: Payer: Self-pay | Admitting: Primary Care

## 2022-05-10 ENCOUNTER — Telehealth (INDEPENDENT_AMBULATORY_CARE_PROVIDER_SITE_OTHER): Payer: BC Managed Care – PPO | Admitting: Primary Care

## 2022-05-10 VITALS — HR 89 | Temp 98.7°F | Wt 263.0 lb

## 2022-05-10 DIAGNOSIS — U071 COVID-19: Secondary | ICD-10-CM | POA: Diagnosis not present

## 2022-05-10 MED ORDER — BENZONATATE 200 MG PO CAPS: 200.0000 mg | ORAL_CAPSULE | Freq: Three times a day (TID) | ORAL | 0 refills | Status: AC | PRN

## 2022-05-10 MED ORDER — MOLNUPIRAVIR EUA 200MG CAPSULE: 4.0000 | ORAL_CAPSULE | Freq: Two times a day (BID) | ORAL | 0 refills | Status: AC

## 2022-05-10 MED ORDER — FLUTICASONE PROPIONATE 50 MCG/ACT NA SUSP: 2.0000 | Freq: Every day | NASAL | 6 refills | Status: DC

## 2022-05-10 NOTE — Patient Instructions (Signed)
Only pick up the antiviral treatment if you are not improving within a few days.   Continue Dayquil and Nyquil as needed.  It was a pleasure to see you today!

## 2022-05-10 NOTE — Assessment & Plan Note (Signed)
Likely from recent travel and attendance at a conference.  He appears stable for outpatient treatment. We discussed antiviral treatment, he technically qualifies given his obesity history.  He would like to have a prescription on hand in case his symptoms do not improve.  No recent renal function on file. Rx for molnupirivir sent to pharmacy for 800 mg BID x 5 days. He understands to take this within five days of symptom onset and to only use if needed.  Continue Dayquil and Nyquil. Continue Ibuprofen PRN.  Follow up PRN.

## 2022-05-10 NOTE — Progress Notes (Signed)
  E-Visit  for Positive Covid Test Result  We are sorry you are not feeling well. We are here to help!  You have tested positive for COVID-19, meaning that you were infected with the novel coronavirus and could give the virus to others.  It is vitally important that you stay home so you do not spread it to others.      Please continue isolation at home, for at least 10 days since the start of your symptoms and until you have had 24 hours with no fever (without taking a fever reducer) and with improving of symptoms.  If you have no symptoms but tested positive (or all symptoms resolve after 5 days and you have no fever) you can leave your house but continue to wear a mask around others for an additional 5 days. If you have a fever,continue to stay home until you have had 24 hours of no fever. Most cases improve 5-10 days from onset but we have seen a small number of patients who have gotten worse after the 10 days.  Please be sure to watch for worsening symptoms and remain taking the proper precautions.   Go to the nearest hospital ED for assessment if fever/cough/breathlessness are severe or illness seems like a threat to life.    The following symptoms may appear 2-14 days after exposure: Fever Cough Shortness of breath or difficulty breathing Chills Repeated shaking with chills Muscle pain Headache Sore throat New loss of taste or smell Fatigue Congestion or runny nose Nausea or vomiting Diarrhea  You have been enrolled in MyChart Home Monitoring for COVID-19. Daily you will receive a questionnaire within the MyChart website. Our COVID-19 response team will be monitoring your responses daily.  You can use medication such as prescription cough medication called Tessalon Perles 100 mg. You may take 1-2 capsules every 8 hours as needed for cough and prescription for Fluticasone nasal spray 2 sprays in each nostril one time per day  You may also take acetaminophen (Tylenol) as needed  for fever.  HOME CARE: Only take medications as instructed by your medical team. Drink plenty of fluids and get plenty of rest. A steam or ultrasonic humidifier can help if you have congestion.   GET HELP RIGHT AWAY IF YOU HAVE EMERGENCY WARNING SIGNS.  Call 911 or proceed to your closest emergency facility if: You develop worsening high fever. Trouble breathing Bluish lips or face Persistent pain or pressure in the chest New confusion Inability to wake or stay awake You cough up blood. Your symptoms become more severe Inability to hold down food or fluids  This list is not all possible symptoms. Contact your medical provider for any symptoms that are severe or concerning to you.    Your e-visit answers were reviewed by a board certified advanced clinical practitioner to complete your personal care plan.  Depending on the condition, your plan could have included both over the counter or prescription medications.  If there is a problem please reply once you have received a response from your provider.  Your safety is important to us.  If you have drug allergies check your prescription carefully.    You can use MyChart to ask questions about today's visit, request a non-urgent call back, or ask for a work or school excuse for 24 hours related to this e-Visit. If it has been greater than 24 hours you will need to follow up with your provider, or enter a new e-Visit to address those   concerns. You will get an e-mail in the next two days asking about your experience.  I hope that your e-visit has been valuable and will speed your recovery. Thank you for using e-visits.  I have provided 5 minutes of non face to face time during this encounter for chart review and documentation.

## 2022-05-10 NOTE — Progress Notes (Signed)
Patient ID: Anthony Ochoa, male    DOB: 08-10-1985, 36 y.o.   MRN: [id number]  Virtual visit completed through [place], a video enabled telemedicine application. Due to national recommendations of social distancing due to COVID-19, a virtual visit is felt to be most appropriate for this patient at this time. Reviewed limitations, risks, security and privacy concerns of performing a virtual visit and the availability of in person appointments. I also reviewed that there may be a patient responsible charge related to this service. The patient agreed to proceed.   Patient location: home Provider location: [place] [facility], office Persons participating in this virtual visit: patient, provider   If any vitals were documented, they were collected by patient at home unless specified below.    Pulse 89   Temp 98.7 F (37.1 C) (Oral)   Wt 263 lb (119.3 kg)   SpO2 96%   BMI 37.74 kg/m    CC: Covid-19 infection Subjective:   HPI: Anthony Ochoa is a 36 y.o. male presenting on 05/10/2022 for Covid Positive (10/19 tested positive /Fever, chills, body aches, fatigue, cough, sore throat)  Symptom onset two days ago after returning from a conference, felt slightly fatigued. The following day he began to feel body aches, fevers, sore throat, cough, chills, headaches. He tested positive for Covid-19 yesterday morning.   He continues to experience cough, chills, body aches. He is wrapped in 3 blankets. He's feeling slightly better today. Questions if he needs antiviral treatment, is concerned about potential long term effects of Covid-19.   Prior to symptom onset he had traveled on 5 different airplanes within a brief period of time. Also attended a conference.  He's been taking Dayquil and Nyquil which has helped to break his fever. His last fever was 99.8 this morning. He took some Advil with resolve of his fever today.   He was evaluated by Dr. Diona Browner on 05/02/22 for a 1 week history  of fatigue, body aches, facial pain, sinus pressure. Treated with azithromycin course and Tessalon Perles.       Relevant past medical, surgical, family and social history reviewed and updated as indicated. Interim medical history since our last visit reviewed. Allergies and medications reviewed and updated. Outpatient Medications Prior to Visit  Medication Sig Dispense Refill   cetirizine (ZYRTEC) 10 MG tablet Take 10 mg by mouth daily.     cyclobenzaprine (FLEXERIL) 5 MG tablet Take 1 tablet (5 mg total) by mouth daily as needed. Migraine. 15 tablet 0   fluticasone (FLONASE) 50 MCG/ACT nasal spray Place 2 sprays into both nostrils daily. 15.8 mL 1   fluticasone (FLONASE) 50 MCG/ACT nasal spray Place 2 sprays into both nostrils daily. 16 g 6   Multiple Vitamins-Minerals (MULTIVITAMIN MEN PO) Take by mouth daily.     rizatriptan (MAXALT) 10 MG tablet Take 1 tablet (10 mg total) by mouth as needed for migraine. May repeat in 2 hours if needed 10 tablet 0   benzonatate (TESSALON) 200 MG capsule Take 1 capsule (200 mg total) by mouth 3 (three) times daily as needed for up to 10 days for cough. 30 capsule 0   azithromycin (ZITHROMAX) 250 MG tablet 2 tab po x 1 day then 1 tab po daily (Patient not taking: Reported on 05/10/2022) 6 tablet 0   benzonatate (TESSALON) 200 MG capsule Take 1 capsule (200 mg total) by mouth 2 (two) times daily as needed for cough. (Patient not taking: Reported on 05/10/2022) 20 capsule 0  No facility-administered medications prior to visit.     Per HPI unless specifically indicated in ROS section below Review of Systems  Constitutional:  Positive for chills, fatigue and fever.  HENT:  Positive for congestion. Negative for sinus pressure.   Respiratory:  Positive for cough.   Musculoskeletal:  Positive for myalgias.   Objective:  Pulse 89   Temp 98.7 F (37.1 C) (Oral)   Wt 263 lb (119.3 kg)   SpO2 96%   BMI 37.74 kg/m   Wt Readings from Last 3 Encounters:   05/10/22 263 lb (119.3 kg)  05/02/22 263 lb 4 oz (119.4 kg)  01/18/22 258 lb (117 kg)       Physical exam: General: Alert and oriented x 3, no distress, appear sickly and tired.  Pulmonary: Speaks in complete sentences without increased work of breathing, no cough during visit.  Psychiatric: Normal mood, thought content, and behavior.     Results for orders placed or performed in visit on 09/12/21  POC COVID-19  Result Value Ref Range   SARS Coronavirus 2 Ag Negative Negative   Assessment & Plan:   Problem List Items Addressed This Visit       Other   COVID-19 virus infection - Primary    Likely from recent travel and attendance at a conference.  He appears stable for outpatient treatment. We discussed antiviral treatment, he technically qualifies given his obesity history.  He would like to have a prescription on hand in case his symptoms do not improve.  No recent renal function on file. Rx for molnupirivir sent to pharmacy for 800 mg BID x 5 days. He understands to take this within five days of symptom onset and to only use if needed.  Continue Dayquil and Nyquil. Continue Ibuprofen PRN.  Follow up PRN.      Relevant Medications   molnupiravir EUA (LAGEVRIO) 200 mg CAPS capsule     Meds ordered this encounter  Medications   molnupiravir EUA (LAGEVRIO) 200 mg CAPS capsule    Sig: Take 4 capsules (800 mg total) by mouth 2 (two) times daily for 5 days.    Dispense:  40 capsule    Refill:  0    Order Specific Question:   Supervising Provider    Answer:   BEDSOLE, AMY E [[id number]]   No orders of the defined types were placed in this encounter.   I discussed the assessment and treatment plan with the patient. The patient was provided an opportunity to ask questions and all were answered. The patient agreed with the plan and demonstrated an understanding of the instructions. The patient was advised to call back or seek an in-person evaluation if the symptoms worsen  or if the condition fails to improve as anticipated.  Follow up plan:  Only pick up the antiviral treatment if you are not improving within a few days.   Continue Dayquil and Nyquil as needed.  It was a pleasure to see you today!   Doreene Nest, NP

## 2022-08-28 ENCOUNTER — Encounter: Payer: Self-pay | Admitting: Family Medicine

## 2022-08-28 ENCOUNTER — Ambulatory Visit: Payer: BC Managed Care – PPO | Admitting: Family Medicine

## 2022-08-28 VITALS — BP 122/80 | HR 81 | Temp 98.1°F | Ht 70.0 in | Wt 260.1 lb

## 2022-08-28 DIAGNOSIS — J019 Acute sinusitis, unspecified: Secondary | ICD-10-CM

## 2022-08-28 DIAGNOSIS — J029 Acute pharyngitis, unspecified: Secondary | ICD-10-CM | POA: Diagnosis not present

## 2022-08-28 DIAGNOSIS — J069 Acute upper respiratory infection, unspecified: Secondary | ICD-10-CM | POA: Diagnosis not present

## 2022-08-28 DIAGNOSIS — R059 Cough, unspecified: Secondary | ICD-10-CM

## 2022-08-28 LAB — POCT RAPID STREP A (OFFICE): Rapid Strep A Screen: NEGATIVE

## 2022-08-28 LAB — POCT INFLUENZA A/B
Influenza A, POC: NEGATIVE
Influenza B, POC: NEGATIVE

## 2022-08-28 NOTE — Patient Instructions (Signed)
Drink fluids and rest  mucinex DM or robitussin dm  is good for cough and congestion  Nasal saline for congestion as needed  Flonase is ok also  Breathe steam  Tylenol or ibuprofen  for fever or pain or headache  Chloraseptic throat spray for sore throat Also salt water gargle  If very runny nose /sneezing antihistamine- claritin   Rest your voice if you are hoarse    Please alert [place] if symptoms worsen (if severe or short of breath please go to the ER)

## 2022-08-28 NOTE — Progress Notes (Signed)
Subjective:    Patient ID: Anthony Ochoa, male    DOB: 10-28-1985, 37 y.o.   MRN: [id number]  HPI Pt presents for uri symptoms  37 yo pt of NP Clark  Wt Readings from Last 3 Encounters:  08/28/22 260 lb 2 oz (118 kg)  05/10/22 263 lb (119.3 kg)  05/02/22 263 lb 4 oz (119.4 kg)   37.32 kg/m  Vitals:   08/28/22 1126  BP: 122/80  Pulse: 81  Temp: 98.1 F (36.7 C)  SpO2: 97%    Neg covid test at home   Started to feel bad yest am  Scratchy throat  Now sore throat  Hoarse voice  Very tired -went to bed at 9   Congested Pnd Snoring  Did not sleep well   Mucous - from throat - had yellow/green color   Cough -sounds dry  Sore to cough  Occ phlegm , yellow in color  No wheezing   No fever that he knows of , no chills or aches   Had a headache yesterday Not today  Otc Day quil  Ny quil last night  Flonase - uses 5 times per week  Claritin   Neg flu and strep tests today   Has seen ENT in past  Told he has problems with r nostril draining   Results for orders placed or performed in visit on 08/28/22  Rapid Strep A  Result Value Ref Range   Rapid Strep A Screen Negative Negative  POCT Influenza A/B  Result Value Ref Range   Influenza A, POC Negative Negative   Influenza B, POC Negative Negative     Patient Active Problem List   Diagnosis Date Noted   Viral URI with cough 08/28/2022   COVID-19 virus infection 05/10/2022   Acute cough 05/02/2022   Lateral epicondylitis of left elbow 04/20/2020   Heartbeat sensations 04/22/2019   Preventative health care 06/24/2018   Migraines 04/16/2018   GERD (gastroesophageal reflux disease) 04/16/2018   Environmental and seasonal allergies 12/05/2016   Past Medical History:  Diagnosis Date   Acute non-recurrent maxillary sinusitis 11/13/2021   Allergy    Chickenpox    Croup    GERD (gastroesophageal reflux disease)    Migraines    Past Surgical History:  Procedure Laterality Date   TONSILLECTOMY   2002   Social History   Tobacco Use   Smoking status: Never   Smokeless tobacco: Never  Substance Use Topics   Alcohol use: Yes   Drug use: Never   Family History  Problem Relation Age of Onset   Miscarriages / Stillbirths Mother    Alcohol abuse Father    Hyperlipidemia Maternal Grandmother    Hypertension Maternal Grandmother    Heart disease Maternal Grandfather    Hyperlipidemia Maternal Grandfather    Hypertension Maternal Grandfather    Stroke Maternal Grandfather    Allergies  Allergen Reactions   Penicillins Rash   Current Outpatient Medications on File Prior to Visit  Medication Sig Dispense Refill   cyclobenzaprine (FLEXERIL) 5 MG tablet Take 1 tablet (5 mg total) by mouth daily as needed. Migraine. 15 tablet 0   fluticasone (FLONASE) 50 MCG/ACT nasal spray Place 2 sprays into both nostrils daily. 16 g 6   Loratadine (CLARITIN PO) Take 1 capsule by mouth daily as needed.     Multiple Vitamins-Minerals (MULTIVITAMIN MEN PO) Take by mouth daily.     rizatriptan (MAXALT) 10 MG tablet Take 1 tablet (10 mg total) by mouth  as needed for migraine. May repeat in 2 hours if needed 10 tablet 0   No current facility-administered medications on file prior to visit.    Review of Systems  Constitutional:  Positive for appetite change and fatigue. Negative for fever.  HENT:  Positive for congestion, postnasal drip, rhinorrhea, sinus pressure, sneezing, sore throat and voice change. Negative for ear pain, facial swelling and trouble swallowing.   Eyes:  Negative for pain and discharge.  Respiratory:  Positive for cough. Negative for shortness of breath, wheezing and stridor.   Cardiovascular:  Negative for chest pain.  Gastrointestinal:  Negative for diarrhea, nausea and vomiting.  Genitourinary:  Negative for frequency, hematuria and urgency.  Musculoskeletal:  Negative for arthralgias and myalgias.  Skin:  Negative for rash.  Neurological:  Positive for headaches. Negative  for dizziness, weakness and light-headedness.  Psychiatric/Behavioral:  Negative for confusion and dysphoric mood.        Objective:   Physical Exam Constitutional:      General: He is not in acute distress.    Appearance: Normal appearance. He is well-developed. He is not ill-appearing, toxic-appearing or diaphoretic.  HENT:     Head: Normocephalic and atraumatic.     Comments: Nares are injected and congested    No facial swelling     Right Ear: Tympanic membrane, ear canal and external ear normal.     Left Ear: Tympanic membrane, ear canal and external ear normal.     Nose: Congestion and rhinorrhea present.     Mouth/Throat:     Mouth: Mucous membranes are moist.     Pharynx: Oropharynx is clear. No oropharyngeal exudate or posterior oropharyngeal erythema.     Comments: Clear pnd   Scant posterior throat erythema  No lesions No swelling Eyes:     General:        Right eye: No discharge.        Left eye: No discharge.     Conjunctiva/sclera: Conjunctivae normal.     Pupils: Pupils are equal, round, and reactive to light.  Cardiovascular:     Rate and Rhythm: Normal rate.     Heart sounds: Normal heart sounds.  Pulmonary:     Effort: Pulmonary effort is normal. No respiratory distress.     Breath sounds: Normal breath sounds. No stridor. No wheezing, rhonchi or rales.     Comments: Good air exch Chest:     Chest wall: No tenderness.  Musculoskeletal:     Cervical back: Normal range of motion and neck supple. No tenderness.  Lymphadenopathy:     Cervical: No cervical adenopathy.  Skin:    General: Skin is warm and dry.     Capillary Refill: Capillary refill takes less than 2 seconds.     Findings: No rash.  Neurological:     Mental Status: He is alert.     Cranial Nerves: No cranial nerve deficit.  Psychiatric:        Mood and Affect: Mood normal.           Assessment & Plan:   Problem List Items Addressed This Visit       Respiratory   Viral URI  with cough    Day 2 of symptoms  Neg covid testing at home Neg strep and flu tests here  Nasal congestion and cough and ST Reassuring exam Disc symptom care in detail (see AVS) Will watch for s/s of bacterial sinus infx which he is prone to  ER precautions noted  Update if not starting to improve in a week or if worsening        Other Visit Diagnoses     Sore throat    -  Primary   Relevant Orders   Rapid Strep A (Completed)   Cough, unspecified type       Relevant Orders   POCT Influenza A/B (Completed)

## 2022-08-28 NOTE — Assessment & Plan Note (Signed)
Day 2 of symptoms  Neg covid testing at home Neg strep and flu tests here  Nasal congestion and cough and ST Reassuring exam Disc symptom care in detail (see AVS) Will watch for s/s of bacterial sinus infx which he is prone to  ER precautions noted Update if not starting to improve in a week or if worsening

## 2022-09-02 MED ORDER — AZITHROMYCIN 250 MG PO TABS: ORAL_TABLET | ORAL | 0 refills | Status: DC

## 2023-05-12 ENCOUNTER — Ambulatory Visit: Payer: BC Managed Care – PPO | Admitting: Family Medicine

## 2023-05-12 ENCOUNTER — Encounter: Payer: Self-pay | Admitting: Family Medicine

## 2023-05-12 VITALS — BP 134/68 | HR 80 | Temp 98.3°F | Ht 70.0 in | Wt 262.0 lb

## 2023-05-12 DIAGNOSIS — R051 Acute cough: Secondary | ICD-10-CM | POA: Diagnosis not present

## 2023-05-12 DIAGNOSIS — J209 Acute bronchitis, unspecified: Secondary | ICD-10-CM

## 2023-05-12 HISTORY — DX: Acute bronchitis, unspecified: J20.9

## 2023-05-12 MED ORDER — BENZONATATE 200 MG PO CAPS: 200.0000 mg | ORAL_CAPSULE | Freq: Three times a day (TID) | ORAL | 1 refills | Status: DC | PRN

## 2023-05-12 MED ORDER — PROMETHAZINE-DM 6.25-15 MG/5ML PO SYRP: 5.0000 mL | ORAL_SOLUTION | Freq: Every evening | ORAL | 0 refills | Status: DC | PRN

## 2023-05-12 MED ORDER — PREDNISONE 20 MG PO TABS: 20.0000 mg | ORAL_TABLET | Freq: Every day | ORAL | 0 refills | Status: DC

## 2023-05-12 NOTE — Assessment & Plan Note (Signed)
S/p viral uri with neg covid testing at home Reassuring exam, few rhonchi and persistent dry sounding cough  Discussed plan  Prednisone 20 mg daily 5 d  Delsym during day  Prometh dm at night prn with caution of sedation  Tessalon tid prn  Update if not starting to improve in a week or if worsening  See AVS for symptoms care recommended  Call back and Er precautions noted in detail today

## 2023-05-12 NOTE — Assessment & Plan Note (Signed)
Post viral cough syndrome with mild bronchitis  See a/p for bronchitis Prednisone  Cough control to break cycle

## 2023-05-12 NOTE — Progress Notes (Signed)
Subjective:    Patient ID: Anthony Ochoa, male    DOB: February 20, 1986, 37 y.o.   MRN: [id number]  HPI  Wt Readings from Last 3 Encounters:  05/12/23 262 lb (118.8 kg)  08/28/22 260 lb 2 oz (118 kg)  05/10/22 263 lb (119.3 kg)   37.59 kg/m  Vitals:   05/12/23 0927  BP: 134/68  Pulse: 80  Temp: 98.3 F (36.8 C)  SpO2: 96%    37 yo pt of NP Clark presents with cough  Had a cold /caught from kids   Had head cold  Nasal symptoms  Drainage Mild fever one day   Now better except for cough  Cough fits  Feels some chest congestion  Ribs hurt  Some phlegm - yellow/green in am and then clear as day goes on   Felt a little wheezing when he exerted himself     Over the counter  Cough drops Tea with honey  Early on some cold meds - did not help     Patient Active Problem List   Diagnosis Date Noted   Acute bronchitis 05/12/2023   Viral URI with cough 08/28/2022   Acute cough 05/02/2022   Lateral epicondylitis of left elbow 04/20/2020   Heartbeat sensations 04/22/2019   Preventative health care 06/24/2018   Migraines 04/16/2018   GERD (gastroesophageal reflux disease) 04/16/2018   Environmental and seasonal allergies 12/05/2016   Past Medical History:  Diagnosis Date   Acute non-recurrent maxillary sinusitis 11/13/2021   Allergy    Chickenpox    Croup    GERD (gastroesophageal reflux disease)    Migraines    Past Surgical History:  Procedure Laterality Date   TONSILLECTOMY  2002   Social History   Tobacco Use   Smoking status: Never   Smokeless tobacco: Never  Substance Use Topics   Alcohol use: Yes   Drug use: Never   Family History  Problem Relation Age of Onset   Miscarriages / Stillbirths Mother    Alcohol abuse Father    Hyperlipidemia Maternal Grandmother    Hypertension Maternal Grandmother    Heart disease Maternal Grandfather    Hyperlipidemia Maternal Grandfather    Hypertension Maternal Grandfather    Stroke Maternal Grandfather     Allergies  Allergen Reactions   Penicillins Rash   Current Outpatient Medications on File Prior to Visit  Medication Sig Dispense Refill   cyclobenzaprine (FLEXERIL) 5 MG tablet Take 1 tablet (5 mg total) by mouth daily as needed. Migraine. 15 tablet 0   fluticasone (FLONASE) 50 MCG/ACT nasal spray Place 2 sprays into both nostrils daily. 16 g 6   Loratadine (CLARITIN PO) Take 1 capsule by mouth daily as needed.     Multiple Vitamins-Minerals (MULTIVITAMIN MEN PO) Take by mouth daily.     rizatriptan (MAXALT) 10 MG tablet Take 1 tablet (10 mg total) by mouth as needed for migraine. May repeat in 2 hours if needed 10 tablet 0   No current facility-administered medications on file prior to visit.    Review of Systems  Constitutional:  Negative for activity change, appetite change, fatigue, fever and unexpected weight change.  HENT:  Negative for congestion, rhinorrhea, sore throat and trouble swallowing.   Eyes:  Negative for pain, redness, itching and visual disturbance.  Respiratory:  Positive for cough and wheezing. Negative for choking, chest tightness, shortness of breath and stridor.   Cardiovascular:  Negative for chest pain and palpitations.  Gastrointestinal:  Negative for abdominal pain, blood  in stool, constipation, diarrhea and nausea.  Endocrine: Negative for cold intolerance, heat intolerance, polydipsia and polyuria.  Genitourinary:  Negative for difficulty urinating, dysuria, frequency and urgency.  Musculoskeletal:  Negative for arthralgias, joint swelling and myalgias.  Skin:  Negative for pallor and rash.  Neurological:  Negative for dizziness, tremors, weakness, numbness and headaches.  Hematological:  Negative for adenopathy. Does not bruise/bleed easily.  Psychiatric/Behavioral:  Negative for decreased concentration and dysphoric mood. The patient is not nervous/anxious.        Objective:   Physical Exam Constitutional:      General: He is not in acute  distress.    Appearance: Normal appearance. He is well-developed. He is obese. He is not ill-appearing or diaphoretic.  HENT:     Head: Normocephalic and atraumatic.     Right Ear: Tympanic membrane and ear canal normal.     Left Ear: Tympanic membrane and ear canal normal.     Nose: Nose normal.     Mouth/Throat:     Mouth: Mucous membranes are moist.     Pharynx: Oropharynx is clear. No posterior oropharyngeal erythema.  Eyes:     General:        Right eye: No discharge.        Left eye: No discharge.     Conjunctiva/sclera: Conjunctivae normal.     Pupils: Pupils are equal, round, and reactive to light.  Neck:     Thyroid: No thyromegaly.     Vascular: No carotid bruit or JVD.  Cardiovascular:     Rate and Rhythm: Normal rate and regular rhythm.     Heart sounds: Normal heart sounds.     No gallop.  Pulmonary:     Effort: Pulmonary effort is normal. No respiratory distress.     Breath sounds: No stridor. Rhonchi present. No wheezing or rales.     Comments: Mild scattered rhonchi  Abdominal:     General: There is no distension or abdominal bruit.     Palpations: Abdomen is soft.  Musculoskeletal:     Cervical back: Normal range of motion and neck supple.     Right lower leg: No edema.     Left lower leg: No edema.  Lymphadenopathy:     Cervical: No cervical adenopathy.  Skin:    General: Skin is warm and dry.     Coloration: Skin is not pale.     Findings: No rash.  Neurological:     Mental Status: He is alert.     Coordination: Coordination normal.     Deep Tendon Reflexes: Reflexes are normal and symmetric. Reflexes normal.  Psychiatric:        Mood and Affect: Mood normal.           Assessment & Plan:   Problem List Items Addressed This Visit       Respiratory   Acute bronchitis - Primary    S/p viral uri with neg covid testing at home Reassuring exam, few rhonchi and persistent dry sounding cough  Discussed plan  Prednisone 20 mg daily 5 d  Delsym  during day  Prometh dm at night prn with caution of sedation  Tessalon tid prn  Update if not starting to improve in a week or if worsening  See AVS for symptoms care recommended  Call back and Er precautions noted in detail today           Other   Acute cough    Post viral cough syndrome  with mild bronchitis  See a/p for bronchitis Prednisone  Cough control to break cycle

## 2023-05-12 NOTE — Patient Instructions (Signed)
Take prednisone as directed for bronchitis and cough  Tessalon three times daily for cough  Delsym over the counter is ok during the day   The prometh DM at bedtime    Update if not starting to improve in a week or if worsening   If any severe symptoms -go to the ER

## 2023-05-23 ENCOUNTER — Ambulatory Visit: Payer: BC Managed Care – PPO | Admitting: Primary Care

## 2023-05-23 ENCOUNTER — Encounter: Payer: Self-pay | Admitting: Primary Care

## 2023-05-23 VITALS — BP 136/78 | HR 76 | Temp 97.4°F | Ht 70.0 in | Wt 261.0 lb

## 2023-05-23 DIAGNOSIS — R058 Other specified cough: Secondary | ICD-10-CM | POA: Insufficient documentation

## 2023-05-23 HISTORY — DX: Other specified cough: R05.8

## 2023-05-23 MED ORDER — OMEPRAZOLE 40 MG PO CPDR: 40.0000 mg | DELAYED_RELEASE_CAPSULE | Freq: Every evening | ORAL | 0 refills | Status: DC

## 2023-05-23 NOTE — Patient Instructions (Signed)
Start omeprazole 40 mg every evening with dinner for your cough.  Please update me early next week as discussed.  It was a pleasure to see you today!

## 2023-05-23 NOTE — Progress Notes (Signed)
Subjective:    Patient ID: Anthony Ochoa, male    DOB: 01/08/1986, 37 y.o.   MRN: [id number]  Cough Pertinent negatives include no chills, ear pain, fever, sore throat or shortness of breath. His past medical history is significant for environmental allergies.    Anthony Ochoa is a very pleasant 37 y.o. male with a history of migraines, GERD, seasonal allergies, bronchitis who presents today to discuss cough.  Evaluated by Dr. Milinda Antis on 05/12/2023 for URI symptoms including head congestion, low-grade fever x 1 day, postnasal drip, coughing fits, chest congestion.  His children had similar symptoms before.  He was diagnosed with acute bronchitis and treated with prednisone 20 mg daily x 5 days, Promethazine DM cough syrup, Tessalon Perles as needed.  Return precautions were provided.  Today he continues to experience his coughing symptoms which have now been present for 3 weeks. Symptoms include coughing fits and head congestion for which he suspects is secondary to allergies. His coughing spells occur mostly in the morning when waking and during the night.   He denies feeling sick, actually feels well except for the cough. He has noticed chest soreness from coughing. He experiences intermittent heartburn symptoms on occasion, no recent symptoms. He continues to take Claritin daily. He denies a history of asthma and is a non smoker.  He has tested negative for COVID-19 infection several times.   Review of Systems  Constitutional:  Negative for chills, fatigue and fever.  HENT:  Positive for congestion. Negative for ear pain, sinus pressure and sore throat.   Respiratory:  Positive for cough and chest tightness. Negative for shortness of breath.   Allergic/Immunologic: Positive for environmental allergies.         Past Medical History:  Diagnosis Date   Acute bronchitis 05/12/2023   Acute non-recurrent maxillary sinusitis 11/13/2021   Allergy    Chickenpox    Croup    GERD  (gastroesophageal reflux disease)    Migraines     Social History   Socioeconomic History   Marital status: Married    Spouse name: Not on file   Number of children: Not on file   Years of education: Not on file   Highest education level: Not on file  Occupational History   Not on file  Tobacco Use   Smoking status: Never   Smokeless tobacco: Never  Substance and Sexual Activity   Alcohol use: Yes   Drug use: Never   Sexual activity: Not on file  Other Topics Concern   Not on file  Social History Narrative   Married.   2 children.   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    Social Determinants of Health   Financial Resource Strain: Not on file  Food Insecurity: Not on file  Transportation Needs: Not on file  Physical Activity: Not on file  Stress: Not on file  Social Connections: Not on file  Intimate Partner Violence: Not on file    Past Surgical History:  Procedure Laterality Date   TONSILLECTOMY  2002    Family History  Problem Relation Age of Onset   Miscarriages / [place] Mother    Alcohol abuse Father    Hyperlipidemia Maternal Grandmother    Hypertension Maternal Grandmother    Heart disease Maternal Grandfather    Hyperlipidemia Maternal Grandfather    Hypertension Maternal Grandfather    Stroke Maternal Grandfather     Allergies  Allergen Reactions   Penicillins Rash    Current Outpatient Medications  on File Prior to Visit  Medication Sig Dispense Refill   benzonatate (TESSALON) 200 MG capsule Take 1 capsule (200 mg total) by mouth 3 (three) times daily as needed. 30 capsule 1   cyclobenzaprine (FLEXERIL) 5 MG tablet Take 1 tablet (5 mg total) by mouth daily as needed. Migraine. 15 tablet 0   fluticasone (FLONASE) 50 MCG/ACT nasal spray Place 2 sprays into both nostrils daily. 16 g 6   Loratadine (CLARITIN PO) Take 1 capsule by mouth daily as needed.     Multiple Vitamins-Minerals (MULTIVITAMIN MEN PO) Take by mouth daily.      promethazine-dextromethorphan (PROMETHAZINE-DM) 6.25-15 MG/5ML syrup Take 5 mLs by mouth at bedtime as needed for cough. Caution of sedation 118 mL 0   rizatriptan (MAXALT) 10 MG tablet Take 1 tablet (10 mg total) by mouth as needed for migraine. May repeat in 2 hours if needed 10 tablet 0   predniSONE (DELTASONE) 20 MG tablet Take 1 tablet (20 mg total) by mouth daily with breakfast. In am (Patient not taking: Reported on 05/23/2023) 5 tablet 0   No current facility-administered medications on file prior to visit.    BP 136/78   Pulse 76   Temp (!) 97.4 F (36.3 C) (Temporal)   Ht 5\' 10"  (1.778 m)   Wt 261 lb (118.4 kg)   SpO2 98%   BMI 37.45 kg/m  Objective:   Physical Exam Constitutional:      Appearance: He is not ill-appearing.  HENT:     Right Ear: Tympanic membrane and ear canal normal.     Left Ear: Tympanic membrane and ear canal normal.     Nose: No mucosal edema.     Right Sinus: No maxillary sinus tenderness or frontal sinus tenderness.     Left Sinus: No maxillary sinus tenderness or frontal sinus tenderness.     Mouth/Throat:     Mouth: Mucous membranes are moist.  Eyes:     Conjunctiva/sclera: Conjunctivae normal.  Cardiovascular:     Rate and Rhythm: Normal rate and regular rhythm.  Pulmonary:     Effort: Pulmonary effort is normal.     Breath sounds: Normal breath sounds. No wheezing or rhonchi.     Comments: Intermittent, dry cough noted during exam Musculoskeletal:     Cervical back: Neck supple.  Skin:    General: Skin is warm and dry.           Assessment & Plan:  Post-viral cough syndrome Assessment & Plan: Suspect silent reflux causing symptoms, especially since he feels well overall. Respiratory exam today benign.  Continue Claritin 10 mg daily. Start omeprazole 40 mg every evening with dinner.  He will update early next week.  Orders: -     Omeprazole; Take 1 capsule (40 mg total) by mouth every evening. For cough  Dispense: 30  capsule; Refill: 0        Doreene Nest, NP

## 2023-05-23 NOTE — Assessment & Plan Note (Signed)
Suspect silent reflux causing symptoms, especially since he feels well overall. Respiratory exam today benign.  Continue Claritin 10 mg daily. Start omeprazole 40 mg every evening with dinner.  He will update early next week.

## 2023-06-30 ENCOUNTER — Other Ambulatory Visit: Payer: Self-pay

## 2023-06-30 DIAGNOSIS — R058 Other specified cough: Secondary | ICD-10-CM

## 2023-06-30 MED ORDER — OMEPRAZOLE 40 MG PO CPDR: 40.0000 mg | DELAYED_RELEASE_CAPSULE | Freq: Every evening | ORAL | 0 refills | Status: DC

## 2023-07-03 ENCOUNTER — Encounter: Payer: Self-pay | Admitting: Family Medicine

## 2023-07-03 ENCOUNTER — Ambulatory Visit: Payer: BC Managed Care – PPO | Admitting: Family Medicine

## 2023-07-03 VITALS — BP 128/68 | HR 72 | Temp 97.9°F | Ht 70.0 in | Wt 268.4 lb

## 2023-07-03 DIAGNOSIS — J029 Acute pharyngitis, unspecified: Secondary | ICD-10-CM | POA: Insufficient documentation

## 2023-07-03 DIAGNOSIS — J0100 Acute maxillary sinusitis, unspecified: Secondary | ICD-10-CM

## 2023-07-03 LAB — POCT RAPID STREP A (OFFICE): Rapid Strep A Screen: NEGATIVE

## 2023-07-03 MED ORDER — AZITHROMYCIN 250 MG PO TABS: ORAL_TABLET | ORAL | 0 refills | Status: DC

## 2023-07-03 NOTE — Assessment & Plan Note (Signed)
Exposure to strep.  Strep test negative rapid in office

## 2023-07-03 NOTE — Progress Notes (Signed)
Patient ID: Anthony Ochoa, male    DOB: 1986/02/03, 36 y.o.   MRN: [id number]  This visit was conducted in person.  BP 128/68   Pulse 72   Temp 97.9 F (36.6 C)   Ht 5\' 10"  (1.778 m)   Wt 268 lb 6 oz (121.7 kg)   SpO2 95%   BMI 38.51 kg/m    CC:  Chief Complaint  Patient presents with   Sore Throat    C/o ST, sinus pressure and drainage. Sxs started 06/28/23. Exposed to strep by son.     Subjective:   HPI: Anthony Ochoa is a 37 y.o. male presenting on 07/03/2023 for Sore Throat (C/o ST, sinus pressure and drainage. Sxs started 06/28/23. Exposed to strep by son. )   Date of onset:  12/7 Initial symptoms included  nasal congestion, scratchy throat Symptoms progressed to  green and yellow phelegm, continue ST, significant post nasal drip  Facial pain, no ear pain, some fullness in right ear, but resolved last night  No fever.  Occ cough    Sick contacts:  son with strep 2 weeks ago initial diagnosis COVID testing:   none     He has tried to treat with  benzonatate, emergen-C, advil  Nasal saline ans floase.     No history of chronic lung disease such as asthma or COPD. Non-smoker.       Relevant past medical, surgical, family and social history reviewed and updated as indicated. Interim medical history since our last visit reviewed. Allergies and medications reviewed and updated. Outpatient Medications Prior to Visit  Medication Sig Dispense Refill   benzonatate (TESSALON) 200 MG capsule Take 1 capsule (200 mg total) by mouth 3 (three) times daily as needed. 30 capsule 1   cyclobenzaprine (FLEXERIL) 5 MG tablet Take 1 tablet (5 mg total) by mouth daily as needed. Migraine. 15 tablet 0   fluticasone (FLONASE) 50 MCG/ACT nasal spray Place 2 sprays into both nostrils daily. 16 g 6   Loratadine (CLARITIN PO) Take 1 capsule by mouth daily as needed.     Multiple Vitamins-Minerals (MULTIVITAMIN MEN PO) Take by mouth daily.     promethazine-dextromethorphan  (PROMETHAZINE-DM) 6.25-15 MG/5ML syrup Take 5 mLs by mouth at bedtime as needed for cough. Caution of sedation 118 mL 0   rizatriptan (MAXALT) 10 MG tablet Take 1 tablet (10 mg total) by mouth as needed for migraine. May repeat in 2 hours if needed 10 tablet 0   omeprazole (PRILOSEC) 40 MG capsule Take 1 capsule (40 mg total) by mouth every evening. For cough 30 capsule 0   No facility-administered medications prior to visit.     Per HPI unless specifically indicated in ROS section below Review of Systems  Constitutional:  Negative for fatigue and fever.  HENT:  Positive for congestion, postnasal drip, sinus pressure and sore throat. Negative for ear pain.   Eyes:  Negative for pain.  Respiratory:  Positive for cough. Negative for shortness of breath.   Cardiovascular:  Negative for chest pain, palpitations and leg swelling.  Gastrointestinal:  Negative for abdominal pain.  Genitourinary:  Negative for dysuria.  Musculoskeletal:  Negative for arthralgias.  Neurological:  Negative for syncope, light-headedness and headaches.  Psychiatric/Behavioral:  Negative for dysphoric mood.    Objective:  BP 128/68   Pulse 72   Temp 97.9 F (36.6 C)   Ht 5\' 10"  (1.778 m)   Wt 268 lb 6 oz (121.7 kg)   SpO2  95%   BMI 38.51 kg/m   Wt Readings from Last 3 Encounters:  07/03/23 268 lb 6 oz (121.7 kg)  05/23/23 261 lb (118.4 kg)  05/12/23 262 lb (118.8 kg)      Physical Exam Vitals reviewed.  Constitutional:      Appearance: He is well-developed.  HENT:     Head: Normocephalic.     Right Ear: Hearing normal.     Left Ear: Hearing normal.     Nose: Nose normal.  Neck:     Thyroid: No thyroid mass or thyromegaly.     Vascular: No carotid bruit.     Trachea: Trachea normal.  Cardiovascular:     Rate and Rhythm: Normal rate and regular rhythm.     Pulses: Normal pulses.     Heart sounds: Heart sounds not distant. No murmur heard.    No friction rub. No gallop.     Comments: No  peripheral edema Pulmonary:     Effort: Pulmonary effort is normal. No respiratory distress.     Breath sounds: Normal breath sounds.  Skin:    General: Skin is warm and dry.     Findings: No rash.  Psychiatric:        Speech: Speech normal.        Behavior: Behavior normal.        Thought Content: Thought content normal.       Results for orders placed or performed in visit on 07/03/23  POCT rapid strep A   Collection Time: 07/03/23  8:47 AM  Result Value Ref Range   Rapid Strep A Screen Negative Negative    Assessment and Plan  Sore throat Assessment & Plan: Exposure to strep.  Strep test negative rapid in office  Orders: -     POCT rapid strep A  Acute non-recurrent maxillary sinusitis Assessment & Plan: Acute, symptoms most consistent with viral sinusitis but if symptoms persist for longer than 2 to 3 days despite Mucinex, Flonase 2 sprays per nostril daily and nasal saline irrigation, patient will fill prescription for azithromycin 5-day course.  Return and ER precautions provided.   Other orders -     Azithromycin; 2 tab po x 1 day then 1 tab po daily  Dispense: 6 tablet; Refill: 0    No follow-ups on file.   Kerby Nora, MD

## 2023-07-03 NOTE — Patient Instructions (Addendum)
Push fluids, rest. Continue nasal saline irrigation and Flonase. Add Mucinex to break up mucus. If symptoms not improving in the next 2 to 3 days start prescription for antibiotics.

## 2023-07-03 NOTE — Assessment & Plan Note (Signed)
Acute, symptoms most consistent with viral sinusitis but if symptoms persist for longer than 2 to 3 days despite Mucinex, Flonase 2 sprays per nostril daily and nasal saline irrigation, patient will fill prescription for azithromycin 5-day course.  Return and ER precautions provided.

## 2023-08-04 ENCOUNTER — Other Ambulatory Visit: Payer: Self-pay | Admitting: Primary Care

## 2023-08-04 DIAGNOSIS — G43909 Migraine, unspecified, not intractable, without status migrainosus: Secondary | ICD-10-CM

## 2023-08-04 MED ORDER — RIZATRIPTAN BENZOATE 10 MG PO TABS
ORAL_TABLET | ORAL | 0 refills | Status: DC
Start: 2023-08-04 — End: 2024-04-16

## 2023-08-09 ENCOUNTER — Other Ambulatory Visit: Payer: Self-pay | Admitting: Primary Care

## 2023-08-09 DIAGNOSIS — R058 Other specified cough: Secondary | ICD-10-CM

## 2023-08-31 DIAGNOSIS — K219 Gastro-esophageal reflux disease without esophagitis: Secondary | ICD-10-CM

## 2023-09-01 NOTE — Telephone Encounter (Signed)

## 2023-09-03 NOTE — Telephone Encounter (Signed)
Called and scheduled patient appt for tomorrow.

## 2023-09-03 NOTE — Telephone Encounter (Signed)
Anthony Ochoa, please contact patient and get him in the office ASAP for physical and GERD treatment.

## 2023-09-04 ENCOUNTER — Ambulatory Visit: Payer: 59 | Admitting: Primary Care

## 2023-09-04 ENCOUNTER — Encounter: Payer: Self-pay | Admitting: Primary Care

## 2023-09-04 VITALS — BP 112/66 | HR 80 | Temp 98.4°F | Ht 68.5 in | Wt 266.5 lb

## 2023-09-04 DIAGNOSIS — K219 Gastro-esophageal reflux disease without esophagitis: Secondary | ICD-10-CM

## 2023-09-04 DIAGNOSIS — E785 Hyperlipidemia, unspecified: Secondary | ICD-10-CM

## 2023-09-04 DIAGNOSIS — Z23 Encounter for immunization: Secondary | ICD-10-CM | POA: Diagnosis not present

## 2023-09-04 DIAGNOSIS — G43009 Migraine without aura, not intractable, without status migrainosus: Secondary | ICD-10-CM | POA: Diagnosis not present

## 2023-09-04 DIAGNOSIS — Z0001 Encounter for general adult medical examination with abnormal findings: Secondary | ICD-10-CM | POA: Diagnosis not present

## 2023-09-04 MED ORDER — OMEPRAZOLE 20 MG PO CPDR: 20.0000 mg | DELAYED_RELEASE_CAPSULE | Freq: Every day | ORAL | 0 refills | Status: DC

## 2023-09-04 NOTE — Assessment & Plan Note (Signed)
Immunizations UTD. Influenza vaccine provided today.  Discussed the importance of a healthy diet and regular exercise in order for weight loss, and to reduce the risk of further co-morbidity.  Exam stable. Labs pending.  Follow up in 1 year for repeat physical.

## 2023-09-04 NOTE — Progress Notes (Signed)
Subjective:    Patient ID: Anthony Ochoa, male    DOB: September 03, 1985, 38 y.o.   MRN: [id number]  HPI  Anthony Ochoa is a very pleasant 38 y.o. male who presents today for complete physical and follow up of chronic conditions.  He would also like to discuss chronic GERD. Chronic for years. Evaluated by ENT once, was told that he has evidence of reflux. Initiated on omeprazole 40 mg in November 2024 for persistent cough after illness. He stopped omerpazole 40 mg about 3 weeks ago.   Two weeks ago he began experiencing burning in his throat, stomach acid in the throat, esophageal burning. He took Tums and drank milk with improvement. He ate pasta prior to symptom onset.   One week ago he ate meatloaf, mashed potatoes, woke up in the middle of the night with throat burning, had to sleep sitting up. The next night he experienced the same symptoms at night. Didn't eat anything acidic or spicy, did not go to bed within 3 hours of eating.   He's been avoiding acidic food, greasy food, caffeine, laying down within 3 hours of eating, cut back on dried cranberries. He's been sitting on a wedge pillow for the last week. He has recently been exercising to lose weight.  Immunizations: -Tetanus: Completed in 2018 -Influenza: Influenza vaccine provided today.   Diet: Fair diet.  Exercise: No regular exercise.  Eye exam: Completes annually  Dental exam: Completes semi-annually    BP Readings from Last 3 Encounters:  09/04/23 112/66  07/03/23 128/68  05/23/23 136/78    Wt Readings from Last 3 Encounters:  09/04/23 266 lb 8 oz (120.9 kg)  07/03/23 268 lb 6 oz (121.7 kg)  05/23/23 261 lb (118.4 kg)       Review of Systems  Constitutional:  Negative for unexpected weight change.  HENT:  Negative for rhinorrhea.   Respiratory:  Negative for cough and shortness of breath.   Cardiovascular:  Negative for chest pain.  Gastrointestinal:  Negative for constipation and diarrhea.       Esophageal  burning, acid reflux  Genitourinary:  Negative for difficulty urinating.  Musculoskeletal:  Negative for arthralgias and myalgias.  Skin:  Negative for rash.  Allergic/Immunologic: Negative for environmental allergies.  Neurological:  Negative for dizziness, numbness and headaches.  Psychiatric/Behavioral:  The patient is not nervous/anxious.          Past Medical History:  Diagnosis Date   Acute bronchitis 05/12/2023   Acute non-recurrent maxillary sinusitis 11/13/2021   Allergy    Chickenpox    Croup    GERD (gastroesophageal reflux disease)    Heartbeat sensations 04/22/2019   Migraines    Post-viral cough syndrome 05/23/2023    Social History   Socioeconomic History   Marital status: Married    Spouse name: Not on file   Number of children: Not on file   Years of education: Not on file   Highest education level: Not on file  Occupational History   Not on file  Tobacco Use   Smoking status: Never   Smokeless tobacco: Never  Substance and Sexual Activity   Alcohol use: Yes   Drug use: Never   Sexual activity: Not on file  Other Topics Concern   Not on file  Social History Narrative   Married.   2 children.   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    Social Drivers of Corporate investment banker Strain: Not on file  Food Insecurity:  Not on file  Transportation Needs: Not on file  Physical Activity: Not on file  Stress: Not on file  Social Connections: Not on file  Intimate Partner Violence: Not on file    Past Surgical History:  Procedure Laterality Date   TONSILLECTOMY  2002    Family History  Problem Relation Age of Onset   Miscarriages / [place] Mother    Alcohol abuse Father    Hyperlipidemia Maternal Grandmother    Hypertension Maternal Grandmother    Heart disease Maternal Grandfather    Hyperlipidemia Maternal Grandfather    Hypertension Maternal Grandfather    Stroke Maternal Grandfather     Allergies  Allergen Reactions    Penicillins Rash    Current Outpatient Medications on File Prior to Visit  Medication Sig Dispense Refill   cyclobenzaprine (FLEXERIL) 5 MG tablet Take 1 tablet (5 mg total) by mouth daily as needed. Migraine. 15 tablet 0   fluticasone (FLONASE) 50 MCG/ACT nasal spray Place 2 sprays into both nostrils daily. 16 g 6   Multiple Vitamins-Minerals (MULTIVITAMIN MEN PO) Take by mouth daily.     rizatriptan (MAXALT) 10 MG tablet Take 1 tablet by mouth at migraine onset. May repeat in 2 hours if needed 10 tablet 0   No current facility-administered medications on file prior to visit.    BP 112/66 (BP Location: Right Arm, Patient Position: Sitting, Cuff Size: Large)   Pulse 80   Temp 98.4 F (36.9 C) (Oral)   Ht 5' 8.5" (1.74 m)   Wt 266 lb 8 oz (120.9 kg)   SpO2 95%   BMI 39.93 kg/m  Objective:   Physical Exam HENT:     Right Ear: Tympanic membrane and ear canal normal.     Left Ear: Tympanic membrane and ear canal normal.  Eyes:     Pupils: Pupils are equal, round, and reactive to light.  Cardiovascular:     Rate and Rhythm: Normal rate and regular rhythm.  Pulmonary:     Effort: Pulmonary effort is normal.     Breath sounds: Normal breath sounds.  Abdominal:     General: Bowel sounds are normal.     Palpations: Abdomen is soft.     Tenderness: There is no abdominal tenderness.  Musculoskeletal:        General: Normal range of motion.     Cervical back: Neck supple.  Skin:    General: Skin is warm and dry.  Neurological:     Mental Status: He is alert and oriented to person, place, and time.     Cranial Nerves: No cranial nerve deficit.     Deep Tendon Reflexes:     Reflex Scores:      Patellar reflexes are 2+ on the right side and 2+ on the left side. Psychiatric:        Mood and Affect: Mood normal.           Assessment & Plan:  Encounter for annual general medical examination with abnormal findings in adult Assessment & Plan: Immunizations UTD. Influenza  vaccine provided today.   Discussed the importance of a healthy diet and regular exercise in order for weight loss, and to reduce the risk of further co-morbidity.  Exam stable. Labs pending.  Follow up in 1 year for repeat physical.    Migraine without aura and without status migrainosus, not intractable Assessment & Plan: Infrequent overall.  Continue Maxalt 10 mg PRN, Flexeril 5 mg PRN.   Gastroesophageal reflux disease, unspecified  whether esophagitis present Assessment & Plan: Deteriorated. Suspect this was secondary to abrupt withdrawal of omeprazole 40 mg a few week ago. Could also be secondary to his weight and recent trigger foods.  Continue to avoid triggers.  Continue to work on weight loss.  Resume omeprazole 20 mg in the evening with a meal. Once symptoms have abated for several weeks we will work on weaning him off by transitioning to famotidine then lifestyle management only.  He will also see GI as scheduled.  Orders: -     Omeprazole; Take 1 capsule (20 mg total) by mouth daily. For heartburn  Dispense: 90 capsule; Refill: 0  Hyperlipidemia, unspecified hyperlipidemia type -     Lipid panel -     Hemoglobin A1c -     Comprehensive metabolic panel -     CBC        Doreene Nest, NP

## 2023-09-04 NOTE — Addendum Note (Signed)
Addended by: Shon Millet on: 09/04/2023 03:42 PM   Modules accepted: Orders

## 2023-09-04 NOTE — Patient Instructions (Signed)
Stop by the lab prior to leaving today. I will notify you of your results once received.   Start omeprazole 20 mg every evening with a meal for heartburn.  Follow-up with GI as scheduled.  It was a pleasure to see you today!

## 2023-09-04 NOTE — Assessment & Plan Note (Signed)
Infrequent overall.  Continue Maxalt 10 mg PRN, Flexeril 5 mg PRN.

## 2023-09-04 NOTE — Assessment & Plan Note (Signed)
Deteriorated. Suspect this was secondary to abrupt withdrawal of omeprazole 40 mg a few week ago. Could also be secondary to his weight and recent trigger foods.  Continue to avoid triggers.  Continue to work on weight loss.  Resume omeprazole 20 mg in the evening with a meal. Once symptoms have abated for several weeks we will work on weaning him off by transitioning to famotidine then lifestyle management only.  He will also see GI as scheduled.

## 2023-09-05 LAB — LIPID PANEL
Cholesterol: 198 mg/dL (ref 0–200)
HDL: 37.1 mg/dL — ABNORMAL LOW (ref >39.00)
LDL Cholesterol: 121 mg/dL — ABNORMAL HIGH (ref 0–99)
NonHDL: 160.84
Total CHOL/HDL Ratio: 5
Triglycerides: 198 mg/dL — ABNORMAL HIGH (ref 0.0–149.0)
VLDL: 39.6 mg/dL (ref 0.0–40.0)

## 2023-09-05 LAB — CBC
HCT: 46.3 % (ref 39.0–52.0)
Hemoglobin: 15.9 g/dL (ref 13.0–17.0)
MCHC: 34.3 g/dL (ref 30.0–36.0)
MCV: 88.8 fL (ref 78.0–100.0)
Platelets: 280 10*3/uL (ref 150.0–400.0)
RBC: 5.22 Mil/uL (ref 4.22–5.81)
RDW: 13.2 % (ref 11.5–15.5)
WBC: 5.9 10*3/uL (ref 4.0–10.5)

## 2023-09-05 LAB — COMPREHENSIVE METABOLIC PANEL
ALT: 34 U/L (ref 0–53)
AST: 25 U/L (ref 0–37)
Albumin: 4.4 g/dL (ref 3.5–5.2)
Alkaline Phosphatase: 82 U/L (ref 39–117)
BUN: 13 mg/dL (ref 6–23)
CO2: 30 meq/L (ref 19–32)
Calcium: 8.9 mg/dL (ref 8.4–10.5)
Chloride: 102 meq/L (ref 96–112)
Creatinine, Ser: 1.03 mg/dL (ref 0.40–1.50)
GFR: 91.88 mL/min (ref >60.00)
Glucose, Bld: 92 mg/dL (ref 70–99)
Potassium: 4.5 meq/L (ref 3.5–5.1)
Sodium: 139 meq/L (ref 135–145)
Total Bilirubin: 0.8 mg/dL (ref 0.2–1.2)
Total Protein: 7.2 g/dL (ref 6.0–8.3)

## 2023-09-05 LAB — HEMOGLOBIN A1C: Hgb A1c MFr Bld: 5.1 % (ref 4.6–6.5)

## 2023-09-15 ENCOUNTER — Encounter: Payer: 59 | Admitting: Primary Care

## 2023-10-29 DIAGNOSIS — K219 Gastro-esophageal reflux disease without esophagitis: Secondary | ICD-10-CM

## 2023-10-30 MED ORDER — OMEPRAZOLE 20 MG PO CPDR: 20.0000 mg | DELAYED_RELEASE_CAPSULE | Freq: Two times a day (BID) | ORAL | 0 refills | Status: DC

## 2023-11-18 ENCOUNTER — Encounter: Payer: Self-pay | Admitting: Family Medicine

## 2023-11-18 ENCOUNTER — Ambulatory Visit: Admitting: Family Medicine

## 2023-11-18 VITALS — BP 110/76 | HR 66 | Temp 97.7°F | Ht 68.5 in | Wt 267.1 lb

## 2023-11-18 DIAGNOSIS — H60502 Unspecified acute noninfective otitis externa, left ear: Secondary | ICD-10-CM

## 2023-11-18 DIAGNOSIS — J0100 Acute maxillary sinusitis, unspecified: Secondary | ICD-10-CM

## 2023-11-18 MED ORDER — FLUTICASONE PROPIONATE 50 MCG/ACT NA SUSP: 2.0000 | Freq: Every day | NASAL | 6 refills | Status: AC

## 2023-11-18 MED ORDER — AZITHROMYCIN 250 MG PO TABS: ORAL_TABLET | ORAL | 0 refills | Status: AC

## 2023-11-18 NOTE — Progress Notes (Signed)
 Patient ID: Anthony Ochoa, male    DOB: Oct 13, 1985, 38 y.o.   MRN: [id number]  This visit was conducted in person.  BP 110/76 (BP Location: Left Arm, Patient Position: Sitting, Cuff Size: Large)   Pulse 66   Temp 97.7 F (36.5 C) (Temporal)   Ht 5' 8.5" (1.74 m)   Wt 267 lb 2 oz (121.2 kg)   SpO2 97%   BMI 40.03 kg/m    CC:  Chief Complaint  Patient presents with   Otalgia    Left Side-Started on Friday Morning   Sore Throat    Subjective:   HPI: Anthony Ochoa is a 38 y.o. male presenting on 11/18/2023 for Otalgia (Left Side-Started on Friday Morning) and Sore Throat   Date of onset:  5 days Initial symptoms included   ST Symptoms progressed to left ear pain, nasal congestion ( has allergies this time of year)  Some Am facial pain... mainly on left.  No fever.   No cough, no SOB.   Sick contacts:  none COVID testing:   none     He has tried to treat with  Advil .. doing allergy meds... uses flonase .   No antibiotics in last month.    History pof allergies, sinus infection. No history of chronic lung disease such as asthma or COPD. Non-smoker.       Relevant past medical, surgical, family and social history reviewed and updated as indicated. Interim medical history since our last visit reviewed. Allergies and medications reviewed and updated. Outpatient Medications Prior to Visit  Medication Sig Dispense Refill   cyclobenzaprine  (FLEXERIL ) 5 MG tablet Take 1 tablet (5 mg total) by mouth daily as needed. Migraine. 15 tablet 0   Multiple Vitamins-Minerals (MULTIVITAMIN MEN PO) Take by mouth daily.     omeprazole  (PRILOSEC) 20 MG capsule Take 1 capsule (20 mg total) by mouth 2 (two) times daily before a meal. For heartburn 180 capsule 0   rizatriptan  (MAXALT ) 10 MG tablet Take 1 tablet by mouth at migraine onset. May repeat in 2 hours if needed 10 tablet 0   fluticasone  (FLONASE ) 50 MCG/ACT nasal spray Place 2 sprays into both nostrils daily. 16 g 6   No  facility-administered medications prior to visit.     Per HPI unless specifically indicated in ROS section below Review of Systems  Constitutional:  Negative for fatigue and fever.  HENT:  Positive for congestion, ear pain, sinus pressure, sinus pain and sore throat.   Eyes:  Negative for pain.  Respiratory:  Negative for cough and shortness of breath.   Cardiovascular:  Negative for chest pain, palpitations and leg swelling.  Gastrointestinal:  Negative for abdominal pain.  Genitourinary:  Negative for dysuria.  Musculoskeletal:  Negative for arthralgias.  Neurological:  Negative for syncope, light-headedness and headaches.  Psychiatric/Behavioral:  Negative for dysphoric mood.    Objective:  BP 110/76 (BP Location: Left Arm, Patient Position: Sitting, Cuff Size: Large)   Pulse 66   Temp 97.7 F (36.5 C) (Temporal)   Ht 5' 8.5" (1.74 m)   Wt 267 lb 2 oz (121.2 kg)   SpO2 97%   BMI 40.03 kg/m   Wt Readings from Last 3 Encounters:  11/18/23 267 lb 2 oz (121.2 kg)  09/04/23 266 lb 8 oz (120.9 kg)  07/03/23 268 lb 6 oz (121.7 kg)      Physical Exam Vitals reviewed.  Constitutional:      Appearance: He is well-developed.  HENT:  Head: Normocephalic.     Right Ear: Hearing and external ear normal. No middle ear effusion. Tympanic membrane is not injected, scarred, perforated, erythematous, retracted or bulging.     Left Ear: Hearing normal. Swelling and tenderness present. A middle ear effusion is present. There is no impacted cerumen. Tympanic membrane is not injected, scarred, perforated, erythematous, retracted or bulging.     Ears:     Comments: Mild erythema in left ear canal.    Nose: Nose normal.  Neck:     Thyroid: No thyroid mass or thyromegaly.     Vascular: No carotid bruit.     Trachea: Trachea normal.  Cardiovascular:     Rate and Rhythm: Normal rate and regular rhythm.     Pulses: Normal pulses.     Heart sounds: Heart sounds not distant. No murmur  heard.    No friction rub. No gallop.     Comments: No peripheral edema Pulmonary:     Effort: Pulmonary effort is normal. No respiratory distress.     Breath sounds: Normal breath sounds.  Skin:    General: Skin is warm and dry.     Findings: No rash.  Psychiatric:        Speech: Speech normal.        Behavior: Behavior normal.        Thought Content: Thought content normal.       Results for orders placed or performed in visit on 09/04/23  Lipid panel   Collection Time: 09/04/23  3:34 PM  Result Value Ref Range   Cholesterol 198 0 - 200 mg/dL   Triglycerides 846.9 (H) 0.0 - 149.0 mg/dL   HDL 62.95 (L) >28.41 mg/dL   VLDL 32.4 0.0 - 40.1 mg/dL   LDL Cholesterol 027 (H) 0 - 99 mg/dL   Total CHOL/HDL Ratio 5    NonHDL 160.84   Hemoglobin A1c   Collection Time: 09/04/23  3:34 PM  Result Value Ref Range   Hgb A1c MFr Bld 5.1 4.6 - 6.5 %  Comprehensive metabolic panel   Collection Time: 09/04/23  3:34 PM  Result Value Ref Range   Sodium 139 135 - 145 mEq/L   Potassium 4.5 3.5 - 5.1 mEq/L   Chloride 102 96 - 112 mEq/L   CO2 30 19 - 32 mEq/L   Glucose, Bld 92 70 - 99 mg/dL   BUN 13 6 - 23 mg/dL   Creatinine, Ser 2.53 0.40 - 1.50 mg/dL   Total Bilirubin 0.8 0.2 - 1.2 mg/dL   Alkaline Phosphatase 82 39 - 117 U/L   AST 25 0 - 37 U/L   ALT 34 0 - 53 U/L   Total Protein 7.2 6.0 - 8.3 g/dL   Albumin 4.4 3.5 - 5.2 g/dL   GFR 66.44 >03.47 mL/min   Calcium 8.9 8.4 - 10.5 mg/dL  CBC   Collection Time: 09/04/23  3:34 PM  Result Value Ref Range   WBC 5.9 4.0 - 10.5 K/uL   RBC 5.22 4.22 - 5.81 Mil/uL   Platelets 280.0 150.0 - 400.0 K/uL   Hemoglobin 15.9 13.0 - 17.0 g/dL   HCT 42.5 95.6 - 38.7 %   MCV 88.8 78.0 - 100.0 fl   MCHC 34.3 30.0 - 36.0 g/dL   RDW 56.4 33.2 - 95.1 %    Assessment and Plan  Acute non-recurrent maxillary sinusitis  Acute otitis externa of left ear, unspecified type  Other orders -     Azithromycin ; Take 2 tablets  on day 1, then 1 tablet daily  on days 2 through 5  Dispense: 6 tablet; Refill: 0 -     Fluticasone  Propionate; Place 2 sprays into both nostrils daily.  Dispense: 16 g; Refill: 6  Chronic allergies leading up to likely bacterial superinfection.  Will treat with antibiotics... PCN allergy... selected Z-pack.  Continue flonase  at 2 sprays per nostril daily and  recommended nasal saline irrigation.  Return and ER precautions provided.   No follow-ups on file.   Herby Lolling, MD

## 2023-12-30 ENCOUNTER — Encounter: Payer: Self-pay | Admitting: Primary Care

## 2023-12-30 ENCOUNTER — Ambulatory Visit (INDEPENDENT_AMBULATORY_CARE_PROVIDER_SITE_OTHER): Admitting: Primary Care

## 2023-12-30 ENCOUNTER — Ambulatory Visit: Payer: Self-pay | Admitting: Primary Care

## 2023-12-30 VITALS — BP 102/62 | HR 83 | Temp 97.6°F | Ht 68.5 in | Wt 260.0 lb

## 2023-12-30 DIAGNOSIS — R112 Nausea with vomiting, unspecified: Secondary | ICD-10-CM | POA: Insufficient documentation

## 2023-12-30 DIAGNOSIS — R197 Diarrhea, unspecified: Secondary | ICD-10-CM | POA: Diagnosis not present

## 2023-12-30 LAB — BASIC METABOLIC PANEL WITH GFR
BUN: 12 mg/dL (ref 6–23)
CO2: 30 meq/L (ref 19–32)
Calcium: 8.9 mg/dL (ref 8.4–10.5)
Chloride: 99 meq/L (ref 96–112)
Creatinine, Ser: 1.13 mg/dL (ref 0.40–1.50)
GFR: 82.03 mL/min (ref >60.00)
Glucose, Bld: 99 mg/dL (ref 70–99)
Potassium: 4.3 meq/L (ref 3.5–5.1)
Sodium: 136 meq/L (ref 135–145)

## 2023-12-30 LAB — CBC WITH DIFFERENTIAL/PLATELET
Basophils Absolute: 0 10*3/uL (ref 0.0–0.1)
Basophils Relative: 0.4 % (ref 0.0–3.0)
Eosinophils Absolute: 0.1 10*3/uL (ref 0.0–0.7)
Eosinophils Relative: 2.2 % (ref 0.0–5.0)
HCT: 48.3 % (ref 39.0–52.0)
Hemoglobin: 16.1 g/dL (ref 13.0–17.0)
Lymphocytes Relative: 27.2 % (ref 12.0–46.0)
Lymphs Abs: 1.4 10*3/uL (ref 0.7–4.0)
MCHC: 33.5 g/dL (ref 30.0–36.0)
MCV: 86.8 fl (ref 78.0–100.0)
Monocytes Absolute: 0.8 10*3/uL (ref 0.1–1.0)
Monocytes Relative: 15.5 % — ABNORMAL HIGH (ref 3.0–12.0)
Neutro Abs: 2.9 10*3/uL (ref 1.4–7.7)
Neutrophils Relative %: 54.7 % (ref 43.0–77.0)
Platelets: 279 10*3/uL (ref 150.0–400.0)
RBC: 5.56 Mil/uL (ref 4.22–5.81)
RDW: 13.7 % (ref 11.5–15.5)
WBC: 5.3 10*3/uL (ref 4.0–10.5)

## 2023-12-30 NOTE — Patient Instructions (Signed)
Stop by the lab prior to leaving today. I will notify you of your results once received.   Please notify me if your symptoms return.  It was a pleasure to see you today!  

## 2023-12-30 NOTE — Progress Notes (Signed)
 Subjective:    Patient ID: Anthony Ochoa, male    DOB: 09-03-1985, 38 y.o.   MRN: [id number]  Diarrhea  Associated symptoms include abdominal pain, a fever and vomiting.    Anthony Ochoa is a very pleasant 38 y.o. male with a history of GERD, migraines who presents today to discuss diarrhea.   Symptom onset early yesterday morning with sudden onset of persistent, liquid diarrhea, nausea and vomiting, abdominal cramping, low grade fevers. Symptoms continued all day yesterday.   His last episode of diarrhea was last night at 11 pm, his last episode of nausea with vomiting was 7 pm last night. He's been trying to sip on water and ice chips. This morning he's been able to drink more and has had some crackers without diarrhea or vomiting.   Currently he feels fatigued with ADLs. He has a sore throat from vomiting and dry heaving. Overall feeling better compared to yesterday.   Prior to symptom onset he had eaten ice cream with his daughter.  His daughter also became sick.  He missed work on Monday and Tuesday today. He may take off tomorrow too.    Review of Systems  Constitutional:  Positive for fatigue and fever.  Gastrointestinal:  Positive for abdominal pain, diarrhea, nausea and vomiting.         Past Medical History:  Diagnosis Date   Acute bronchitis 05/12/2023   Acute non-recurrent maxillary sinusitis 11/13/2021   Allergy    Chickenpox    Croup    GERD (gastroesophageal reflux disease)    Heartbeat sensations 04/22/2019   Migraines    Post-viral cough syndrome 05/23/2023    Social History   Socioeconomic History   Marital status: Married    Spouse name: Not on file   Number of children: Not on file   Years of education: Not on file   Highest education level: Not on file  Occupational History   Not on file  Tobacco Use   Smoking status: Never   Smokeless tobacco: Never  Substance and Sexual Activity   Alcohol use: Yes   Drug use: Never   Sexual activity:  Not on file  Other Topics Concern   Not on file  Social History Narrative   Married.   2 children.   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    Social Drivers of Corporate investment banker Strain: Not on file  Food Insecurity: Not on file  Transportation Needs: Not on file  Physical Activity: Not on file  Stress: Not on file  Social Connections: Not on file  Intimate Partner Violence: Not on file    Past Surgical History:  Procedure Laterality Date   TONSILLECTOMY  2002    Family History  Problem Relation Age of Onset   Miscarriages / [place] Mother    Alcohol abuse Father    Hyperlipidemia Maternal Grandmother    Hypertension Maternal Grandmother    Heart disease Maternal Grandfather    Hyperlipidemia Maternal Grandfather    Hypertension Maternal Grandfather    Stroke Maternal Grandfather     Allergies  Allergen Reactions   Penicillins Rash    Current Outpatient Medications on File Prior to Visit  Medication Sig Dispense Refill   cyclobenzaprine  (FLEXERIL ) 5 MG tablet Take 1 tablet (5 mg total) by mouth daily as needed. Migraine. 15 tablet 0   fluticasone  (FLONASE ) 50 MCG/ACT nasal spray Place 2 sprays into both nostrils daily. 16 g 6   Multiple Vitamins-Minerals (MULTIVITAMIN MEN PO)  Take by mouth daily.     omeprazole  (PRILOSEC) 20 MG capsule Take 1 capsule (20 mg total) by mouth 2 (two) times daily before a meal. For heartburn 180 capsule 0   rizatriptan  (MAXALT ) 10 MG tablet Take 1 tablet by mouth at migraine onset. May repeat in 2 hours if needed 10 tablet 0   No current facility-administered medications on file prior to visit.    BP 102/62   Pulse 83   Temp 97.6 F (36.4 C) (Temporal)   Ht 5' 8.5" (1.74 m)   Wt 260 lb (117.9 kg)   SpO2 96%   BMI 38.96 kg/m  Objective:   Physical Exam Cardiovascular:     Rate and Rhythm: Normal rate and regular rhythm.  Pulmonary:     Effort: Pulmonary effort is normal.     Breath sounds: Normal breath sounds.   Abdominal:     Palpations: Abdomen is soft.     Tenderness: There is no abdominal tenderness.  Musculoskeletal:     Cervical back: Neck supple.  Skin:    General: Skin is warm and dry.  Neurological:     Mental Status: He is alert and oriented to person, place, and time.  Psychiatric:        Mood and Affect: Mood normal.           Assessment & Plan:  Nausea vomiting and diarrhea Assessment & Plan: Symptoms suggestive of viral etiology, especially as his daughter was also sick and given improvement today.  We discussed the importance of hydration for now.  Advance diet as tolerated later.  He understands a brat diet.  Checking labs today including BMP, CBC with differential. If diarrhea returns then we will obtain stool studies.  Work note provided today.  Orders: -     CBC with Differential/Platelet -     Basic metabolic panel with GFR        Gabriel John, NP

## 2023-12-30 NOTE — Assessment & Plan Note (Signed)
 Symptoms suggestive of viral etiology, especially as his daughter was also sick and given improvement today.  We discussed the importance of hydration for now.  Advance diet as tolerated later.  He understands a brat diet.  Checking labs today including BMP, CBC with differential. If diarrhea returns then we will obtain stool studies.  Work note provided today.

## 2024-01-05 ENCOUNTER — Ambulatory Visit: Admitting: General Practice

## 2024-02-04 ENCOUNTER — Other Ambulatory Visit: Payer: Self-pay

## 2024-02-04 DIAGNOSIS — K219 Gastro-esophageal reflux disease without esophagitis: Secondary | ICD-10-CM

## 2024-02-04 MED ORDER — OMEPRAZOLE 20 MG PO CPDR: 20.0000 mg | DELAYED_RELEASE_CAPSULE | Freq: Two times a day (BID) | ORAL | 0 refills | Status: DC

## 2024-04-15 DIAGNOSIS — G43909 Migraine, unspecified, not intractable, without status migrainosus: Secondary | ICD-10-CM

## 2024-04-16 MED ORDER — RIZATRIPTAN BENZOATE 10 MG PO TABS: ORAL_TABLET | ORAL | 0 refills | Status: AC

## 2024-05-05 ENCOUNTER — Other Ambulatory Visit: Payer: Self-pay

## 2024-05-05 DIAGNOSIS — K219 Gastro-esophageal reflux disease without esophagitis: Secondary | ICD-10-CM

## 2024-05-05 MED ORDER — OMEPRAZOLE 20 MG PO CPDR: 20.0000 mg | DELAYED_RELEASE_CAPSULE | Freq: Two times a day (BID) | ORAL | 0 refills | Status: AC

## 2024-07-02 ENCOUNTER — Encounter: Payer: Self-pay | Admitting: Primary Care

## 2024-07-02 ENCOUNTER — Ambulatory Visit: Payer: Self-pay | Admitting: Primary Care

## 2024-07-02 ENCOUNTER — Ambulatory Visit: Admitting: Primary Care

## 2024-07-02 ENCOUNTER — Ambulatory Visit: Payer: Self-pay

## 2024-07-02 VITALS — BP 118/66 | HR 124 | Temp 102.9°F | Ht 68.5 in | Wt 277.5 lb

## 2024-07-02 DIAGNOSIS — J101 Influenza due to other identified influenza virus with other respiratory manifestations: Secondary | ICD-10-CM | POA: Insufficient documentation

## 2024-07-02 DIAGNOSIS — R509 Fever, unspecified: Secondary | ICD-10-CM

## 2024-07-02 LAB — POC COVID19 BINAXNOW: SARS Coronavirus 2 Ag: NEGATIVE

## 2024-07-02 LAB — POCT INFLUENZA A/B
Influenza A, POC: POSITIVE — AB
Influenza B, POC: NEGATIVE

## 2024-07-02 MED ORDER — OSELTAMIVIR PHOSPHATE 75 MG PO CAPS: 75.0000 mg | ORAL_CAPSULE | Freq: Two times a day (BID) | ORAL | 0 refills | Status: AC

## 2024-07-02 MED ORDER — HYDROCOD POLI-CHLORPHE POLI ER 10-8 MG/5ML PO SUER: 5.0000 mL | Freq: Two times a day (BID) | ORAL | 0 refills | Status: AC | PRN

## 2024-07-02 NOTE — Progress Notes (Signed)
 Subjective:    Patient ID: Anthony Ochoa, male    DOB: [birth year], 38 y.o.   MRN: [id number]  Anthony Ochoa is a very pleasant 38 y.o. male with a history of migraines, GERD, environmental and seasonal allergies who presents today to discuss acute cough.  Symptom onset 3 days ago with fatigue. Last night he experienced a sudden onset of fevers (t-max of 102.9), body aches, chills, fatigue. He's since developed sinus pressure and a cough. He's taken Advil  cold and sinus, Tylenol. He's staying hydrated with Gatorade, tea, and water.   He received a flu vaccine this season. He feels awful. No one else in the house is sick.    Review of Systems  Constitutional:  Positive for appetite change, chills, diaphoresis, fatigue and fever.  HENT:  Positive for congestion, sinus pressure, sinus pain and sore throat. Negative for ear pain.   Respiratory:  Positive for cough.   Gastrointestinal:  Negative for abdominal pain.  Neurological:  Positive for headaches.         Past Medical History:  Diagnosis Date   Acute bronchitis 05/12/2023   Acute non-recurrent maxillary sinusitis 11/13/2021   Allergy    Chickenpox    Croup    GERD (gastroesophageal reflux disease)    Heartbeat sensations 04/22/2019   Migraines    Post-viral cough syndrome 05/23/2023    Social History   Socioeconomic History   Marital status: Married    Spouse name: Not on file   Number of children: Not on file   Years of education: Not on file   Highest education level: Not on file  Occupational History   Not on file  Tobacco Use   Smoking status: Never   Smokeless tobacco: Never  Substance and Sexual Activity   Alcohol use: Yes   Drug use: Never   Sexual activity: Not on file  Other Topics Concern   Not on file  Social History Narrative   Married.   2 children.   Works at [employer] in [employer].    Social Drivers of Health   Tobacco Use: Low Risk (07/02/2024)   Patient History    Smoking Tobacco  Use: Never    Smokeless Tobacco Use: Never    Passive Exposure: Not on file  Financial Resource Strain: Not on file  Food Insecurity: Not on file  Transportation Needs: Not on file  Physical Activity: Not on file  Stress: Not on file  Social Connections: Not on file  Intimate Partner Violence: Not on file  Depression (PHQ2-9): Low Risk (07/02/2024)   Depression (PHQ2-9)    PHQ-2 Score: 4  Alcohol Screen: Not on file  Housing: Not on file  Utilities: Not on file  Health Literacy: Not on file    Past Surgical History:  Procedure Laterality Date   TONSILLECTOMY  2002    Family History  Problem Relation Age of Onset   Miscarriages / Stillbirths Mother    Alcohol abuse Father    Hyperlipidemia Maternal Grandmother    Hypertension Maternal Grandmother    Heart disease Maternal Grandfather    Hyperlipidemia Maternal Grandfather    Hypertension Maternal Grandfather    Stroke Maternal Grandfather     Allergies[1]  Medications Ordered Prior to Encounter[2]  BP 118/66   Pulse (!) 124   Temp (!) 102.9 F (39.4 C) (Oral)   Ht 5' 8.5 (1.74 m)   Wt 277 lb 8 oz (125.9 kg)   SpO2 96%   BMI 41.58 kg/m  Objective:   Physical Exam Constitutional:      Appearance: He is ill-appearing.  HENT:     Right Ear: Tympanic membrane and ear canal normal.     Left Ear: Tympanic membrane and ear canal normal.     Nose: No mucosal edema.     Right Sinus: No maxillary sinus tenderness or frontal sinus tenderness.     Left Sinus: No maxillary sinus tenderness or frontal sinus tenderness.  Eyes:     Conjunctiva/sclera: Conjunctivae normal.  Cardiovascular:     Rate and Rhythm: Regular rhythm. Tachycardia present.  Pulmonary:     Effort: Pulmonary effort is normal.     Breath sounds: Examination of the right-upper field reveals rhonchi. Examination of the left-upper field reveals rhonchi. Examination of the right-lower field reveals rhonchi. Examination of the left-lower field reveals  rhonchi. Rhonchi present.  Skin:    General: Skin is warm.     Physical Exam        Assessment & Plan:  Influenza A Assessment & Plan: Positive influenza A test today. COVID-19 test negative.  Discussed conservative treatment. Start Tamiflu 75 mg twice daily x 5 days. Prescription for Tussionex provided to use at bedtime for sleep and cough.  Continue to hydrate well with water.  Follow-up as needed.  Orders: -     Oseltamivir Phosphate; Take 1 capsule (75 mg total) by mouth 2 (two) times daily for 5 days.  Dispense: 10 capsule; Refill: 0 -     Hydrocod Poli-Chlorphe Poli ER; Take 5 mLs by mouth every 12 (twelve) hours as needed for cough.  Dispense: 50 mL; Refill: 0  Fever, unspecified fever cause -     POC COVID-19 BinaxNow -     POCT Influenza A/B    Assessment and Plan Assessment & Plan         Comer MARLA Gaskins, NP       [1]  Allergies Allergen Reactions   Penicillins Rash  [2]  Current Outpatient Medications on File Prior to Visit  Medication Sig Dispense Refill   cyclobenzaprine  (FLEXERIL ) 5 MG tablet Take 1 tablet (5 mg total) by mouth daily as needed. Migraine. 15 tablet 0   fluticasone  (FLONASE ) 50 MCG/ACT nasal spray Place 2 sprays into both nostrils daily. 16 g 6   Multiple Vitamins-Minerals (MULTIVITAMIN MEN PO) Take by mouth daily.     omeprazole  (PRILOSEC) 20 MG capsule Take 1 capsule (20 mg total) by mouth 2 (two) times daily before a meal. For heartburn 180 capsule 0   rizatriptan  (MAXALT ) 10 MG tablet Take 1 tablet by mouth at migraine onset. May repeat in 2 hours if needed 10 tablet 0   No current facility-administered medications on file prior to visit.

## 2024-07-02 NOTE — Telephone Encounter (Signed)
 FYI Only or Action Required?: FYI only for provider: appointment scheduled on 07/02/24.  Patient was last seen in primary care on 12/30/2023 by Gretta Comer POUR, NP.  Called Nurse Triage reporting Cough.  Symptoms began yesterday.  Interventions attempted: OTC medications: Tylenol, Advil  cold and flu.  Symptoms are: stable.  Triage Disposition: See Physician Within 24 Hours  Patient/caregiver understands and will follow disposition?: Yes Reason for Disposition  [1] Continuous (nonstop) coughing interferes with work or school AND [2] no improvement using cough treatment per Care Advice  Answer Assessment - Initial Assessment Questions Patient has taken Advil  cold and sinus and Tylenol.   1. ONSET: When did the cough begin?      Yesterday  2. SEVERITY: How bad is the cough today?      Moderate  3. SPUTUM: Describe the color of your sputum (e.g., none, dry cough; clear, white, yellow, green)     Yellow  4. DIFFICULTY BREATHING: Are you having difficulty breathing? If Yes, ask: How bad is it? (e.g., mild, moderate, severe)      Denies  6. FEVER: Do you have a fever? If Yes, ask: What is your temperature, how was it measured, and when did it start?     99.9   7. CARDIAC HISTORY: Do you have any history of heart disease? (e.g., heart attack, congestive heart failure)      Denies  8. LUNG HISTORY: Do you have any history of lung disease?  (e.g., pulmonary embolus, asthma, emphysema)     Denies  10. OTHER SYMPTOMS: Do you have any other symptoms? (e.g., runny nose, wheezing, chest pain)       Fatigued, chills, notices wheezing but reports from nasal / chest congestion, sore throat  Protocols used: Cough - Acute Productive-A-AH  Copied from CRM #[id number]. Topic: Clinical - Red Word Triage >> Jul 02, 2024  8:11 AM Tiffini S wrote: [facility] that prompted transfer to Nurse Triage: weak, fatigue, chills, fever and cough with yellow phlegm and head stuffiness  that started yesterday

## 2024-07-02 NOTE — Telephone Encounter (Signed)
 Noted.  Will evaluate.

## 2024-07-02 NOTE — Assessment & Plan Note (Signed)
 Positive influenza A test today. COVID-19 test negative.  Discussed conservative treatment. Start Tamiflu 75 mg twice daily x 5 days. Prescription for Tussionex provided to use at bedtime for sleep and cough.  Continue to hydrate well with water.  Follow-up as needed.

## 2024-07-02 NOTE — Patient Instructions (Signed)
 Start Tamiflu for influenza.  Take 1 capsule by mouth twice daily for 5 days.  Start Tussionex cough suppressant. Take 5 ml every 12 hours as needed for cough and rest. Caution this medication contains codeine which may cause drowsiness.   Continue Advil /Tylenol.  It was a pleasure to see you today!

## 2024-09-10 ENCOUNTER — Encounter: Admitting: Primary Care
